# Patient Record
Sex: Female | Born: 1935 | Race: Black or African American | Hispanic: No | Smoking: Never smoker
Health system: Southern US, Community
[De-identification: ages and names within clinical notes are randomized; demographics above are authoritative.]

## PROBLEM LIST (undated history)

## (undated) DIAGNOSIS — E039 Hypothyroidism, unspecified: Secondary | ICD-10-CM

## (undated) DIAGNOSIS — K429 Umbilical hernia without obstruction or gangrene: Secondary | ICD-10-CM

## (undated) DIAGNOSIS — Z923 Personal history of irradiation: Secondary | ICD-10-CM

## (undated) DIAGNOSIS — R7303 Prediabetes: Secondary | ICD-10-CM

## (undated) DIAGNOSIS — C50919 Malignant neoplasm of unspecified site of unspecified female breast: Secondary | ICD-10-CM

## (undated) DIAGNOSIS — I10 Essential (primary) hypertension: Secondary | ICD-10-CM

## (undated) HISTORY — DX: Personal history of irradiation: Z92.3

## (undated) HISTORY — DX: Umbilical hernia without obstruction or gangrene: K42.9

## (undated) HISTORY — DX: Essential (primary) hypertension: I10

## (undated) HISTORY — PX: HERNIA REPAIR: SHX51

## (undated) HISTORY — DX: Hypothyroidism, unspecified: E03.9

---

## 1998-03-13 ENCOUNTER — Ambulatory Visit (HOSPITAL_COMMUNITY): Admission: RE | Admit: 1998-03-13 | Discharge: 1998-03-13 | Payer: Self-pay

## 1999-11-13 ENCOUNTER — Ambulatory Visit (HOSPITAL_COMMUNITY): Admission: RE | Admit: 1999-11-13 | Discharge: 1999-11-13 | Payer: Self-pay | Admitting: Obstetrics & Gynecology

## 2002-03-22 ENCOUNTER — Ambulatory Visit (HOSPITAL_COMMUNITY): Admission: RE | Admit: 2002-03-22 | Discharge: 2002-03-22 | Payer: Self-pay | Admitting: *Deleted

## 2009-09-21 ENCOUNTER — Ambulatory Visit: Payer: Self-pay | Admitting: Internal Medicine

## 2010-03-22 ENCOUNTER — Ambulatory Visit: Payer: Self-pay

## 2010-03-27 DIAGNOSIS — C50919 Malignant neoplasm of unspecified site of unspecified female breast: Secondary | ICD-10-CM

## 2010-03-27 HISTORY — DX: Malignant neoplasm of unspecified site of unspecified female breast: C50.919

## 2010-06-12 ENCOUNTER — Ambulatory Visit: Payer: Self-pay | Admitting: Surgery

## 2010-06-19 ENCOUNTER — Observation Stay: Payer: Medicare Other | Admitting: Surgery

## 2010-06-19 HISTORY — PX: MASTECTOMY: SHX3

## 2010-06-20 LAB — PATHOLOGY REPORT

## 2010-08-27 ENCOUNTER — Ambulatory Visit: Payer: Self-pay | Admitting: Internal Medicine

## 2010-08-29 ENCOUNTER — Ambulatory Visit: Payer: Self-pay | Admitting: Internal Medicine

## 2010-08-30 ENCOUNTER — Ambulatory Visit: Payer: Self-pay | Admitting: Internal Medicine

## 2010-08-31 ENCOUNTER — Ambulatory Visit: Payer: Self-pay | Admitting: Internal Medicine

## 2010-09-29 ENCOUNTER — Ambulatory Visit: Payer: Self-pay | Admitting: Internal Medicine

## 2010-10-29 ENCOUNTER — Ambulatory Visit: Payer: Self-pay | Admitting: Internal Medicine

## 2010-11-29 ENCOUNTER — Ambulatory Visit: Payer: Self-pay | Admitting: Internal Medicine

## 2010-12-29 ENCOUNTER — Ambulatory Visit: Payer: Self-pay | Admitting: Internal Medicine

## 2011-03-29 ENCOUNTER — Ambulatory Visit: Payer: Self-pay | Admitting: Internal Medicine

## 2011-04-18 LAB — CBC CANCER CENTER
Basophil #: 0.2 x10 3/mm — ABNORMAL HIGH (ref 0.0–0.1)
Basophil %: 2.5 %
Eosinophil #: 0.1 x10 3/mm (ref 0.0–0.7)
Eosinophil %: 1.7 %
HCT: 37.4 % (ref 35.0–47.0)
HGB: 12.2 g/dL (ref 12.0–16.0)
Lymphocyte #: 1.3 x10 3/mm (ref 1.0–3.6)
Lymphocyte %: 21.3 %
MCH: 28.5 pg (ref 26.0–34.0)
MCHC: 32.6 g/dL (ref 32.0–36.0)
MCV: 87.6 fL (ref 80–100)
Monocyte #: 0.5 x10 3/mm (ref 0.0–0.7)
Monocyte %: 7.9 %
Neutrophil #: 4.2 x10 3/mm (ref 1.4–6.5)
Neutrophil %: 66.6 %
Platelet: 228 x10 3/mm (ref 150–440)
RBC: 4.27 10*6/uL (ref 3.80–5.20)
RDW: 13.3 % (ref 11.5–14.5)
WBC: 6.3 x10 3/mm (ref 3.6–11.0)

## 2011-04-18 LAB — COMPREHENSIVE METABOLIC PANEL
Albumin: 3.7 g/dL (ref 3.4–5.0)
Alkaline Phosphatase: 75 U/L (ref 50–136)
Anion Gap: 7 (ref 7–16)
BUN: 21 mg/dL — ABNORMAL HIGH (ref 7–18)
Bilirubin,Total: 0.4 mg/dL (ref 0.2–1.0)
Calcium, Total: 9 mg/dL (ref 8.5–10.1)
Chloride: 102 mmol/L (ref 98–107)
Co2: 33 mmol/L — ABNORMAL HIGH (ref 21–32)
Creatinine: 0.91 mg/dL (ref 0.60–1.30)
EGFR (African American): >60
EGFR (Non-African Amer.): >60
Glucose: 118 mg/dL — ABNORMAL HIGH (ref 65–99)
Osmolality: 287 (ref 275–301)
Potassium: 4.2 mmol/L (ref 3.5–5.1)
SGOT(AST): 10 U/L — ABNORMAL LOW (ref 15–37)
SGPT (ALT): 17 U/L
Sodium: 142 mmol/L (ref 136–145)
Total Protein: 7.4 g/dL (ref 6.4–8.2)

## 2011-04-29 ENCOUNTER — Ambulatory Visit: Payer: Self-pay | Admitting: Internal Medicine

## 2011-06-03 ENCOUNTER — Ambulatory Visit: Payer: Self-pay | Admitting: Internal Medicine

## 2011-06-29 ENCOUNTER — Ambulatory Visit: Payer: Self-pay | Admitting: Internal Medicine

## 2011-11-07 ENCOUNTER — Ambulatory Visit: Payer: Self-pay | Admitting: Internal Medicine

## 2011-12-04 ENCOUNTER — Ambulatory Visit: Payer: Self-pay | Admitting: Internal Medicine

## 2011-12-29 ENCOUNTER — Ambulatory Visit: Payer: Self-pay | Admitting: Internal Medicine

## 2012-02-13 ENCOUNTER — Ambulatory Visit: Payer: Self-pay | Admitting: Internal Medicine

## 2012-02-13 LAB — CBC CANCER CENTER
Basophil #: 0.1 x10 3/mm (ref 0.0–0.1)
Basophil %: 1.1 %
Eosinophil #: 0.1 x10 3/mm (ref 0.0–0.7)
Eosinophil %: 2.1 %
HCT: 37.8 % (ref 35.0–47.0)
HGB: 12.1 g/dL (ref 12.0–16.0)
Lymphocyte #: 1.5 x10 3/mm (ref 1.0–3.6)
Lymphocyte %: 29.3 %
MCH: 27.8 pg (ref 26.0–34.0)
MCHC: 32.1 g/dL (ref 32.0–36.0)
MCV: 87 fL (ref 80–100)
Monocyte #: 0.5 x10 3/mm (ref 0.2–0.9)
Monocyte %: 9.3 %
Neutrophil #: 3 x10 3/mm (ref 1.4–6.5)
Neutrophil %: 58.2 %
Platelet: 212 x10 3/mm (ref 150–440)
RBC: 4.36 10*6/uL (ref 3.80–5.20)
RDW: 13.8 % (ref 11.5–14.5)
WBC: 5.1 x10 3/mm (ref 3.6–11.0)

## 2012-02-13 LAB — HEPATIC FUNCTION PANEL A (ARMC)
Albumin: 3.7 g/dL (ref 3.4–5.0)
Alkaline Phosphatase: 65 U/L (ref 50–136)
Bilirubin, Direct: 0.1 mg/dL (ref 0.00–0.20)
Bilirubin,Total: 0.3 mg/dL (ref 0.2–1.0)
SGOT(AST): 15 U/L (ref 15–37)
SGPT (ALT): 20 U/L (ref 12–78)
Total Protein: 7.4 g/dL (ref 6.4–8.2)

## 2012-02-13 LAB — CREATININE, SERUM
Creatinine: 0.83 mg/dL (ref 0.60–1.30)
EGFR (African American): >60
EGFR (Non-African Amer.): >60

## 2012-02-29 ENCOUNTER — Ambulatory Visit: Payer: Self-pay | Admitting: Internal Medicine

## 2012-04-24 ENCOUNTER — Ambulatory Visit: Payer: Self-pay | Admitting: Internal Medicine

## 2012-08-11 IMAGING — MG MM DIGITAL DIAGNOSTIC UNILAT*R* W/ CAD
1 series · 3 of 3 positions shown · non-contrast
Comparison: none

REASON FOR EXAM: HX BRST CA LT MAST
COMMENTS:

[Series 6089: R CC · right · 3 of 3 slices shown]
[im 1/3]
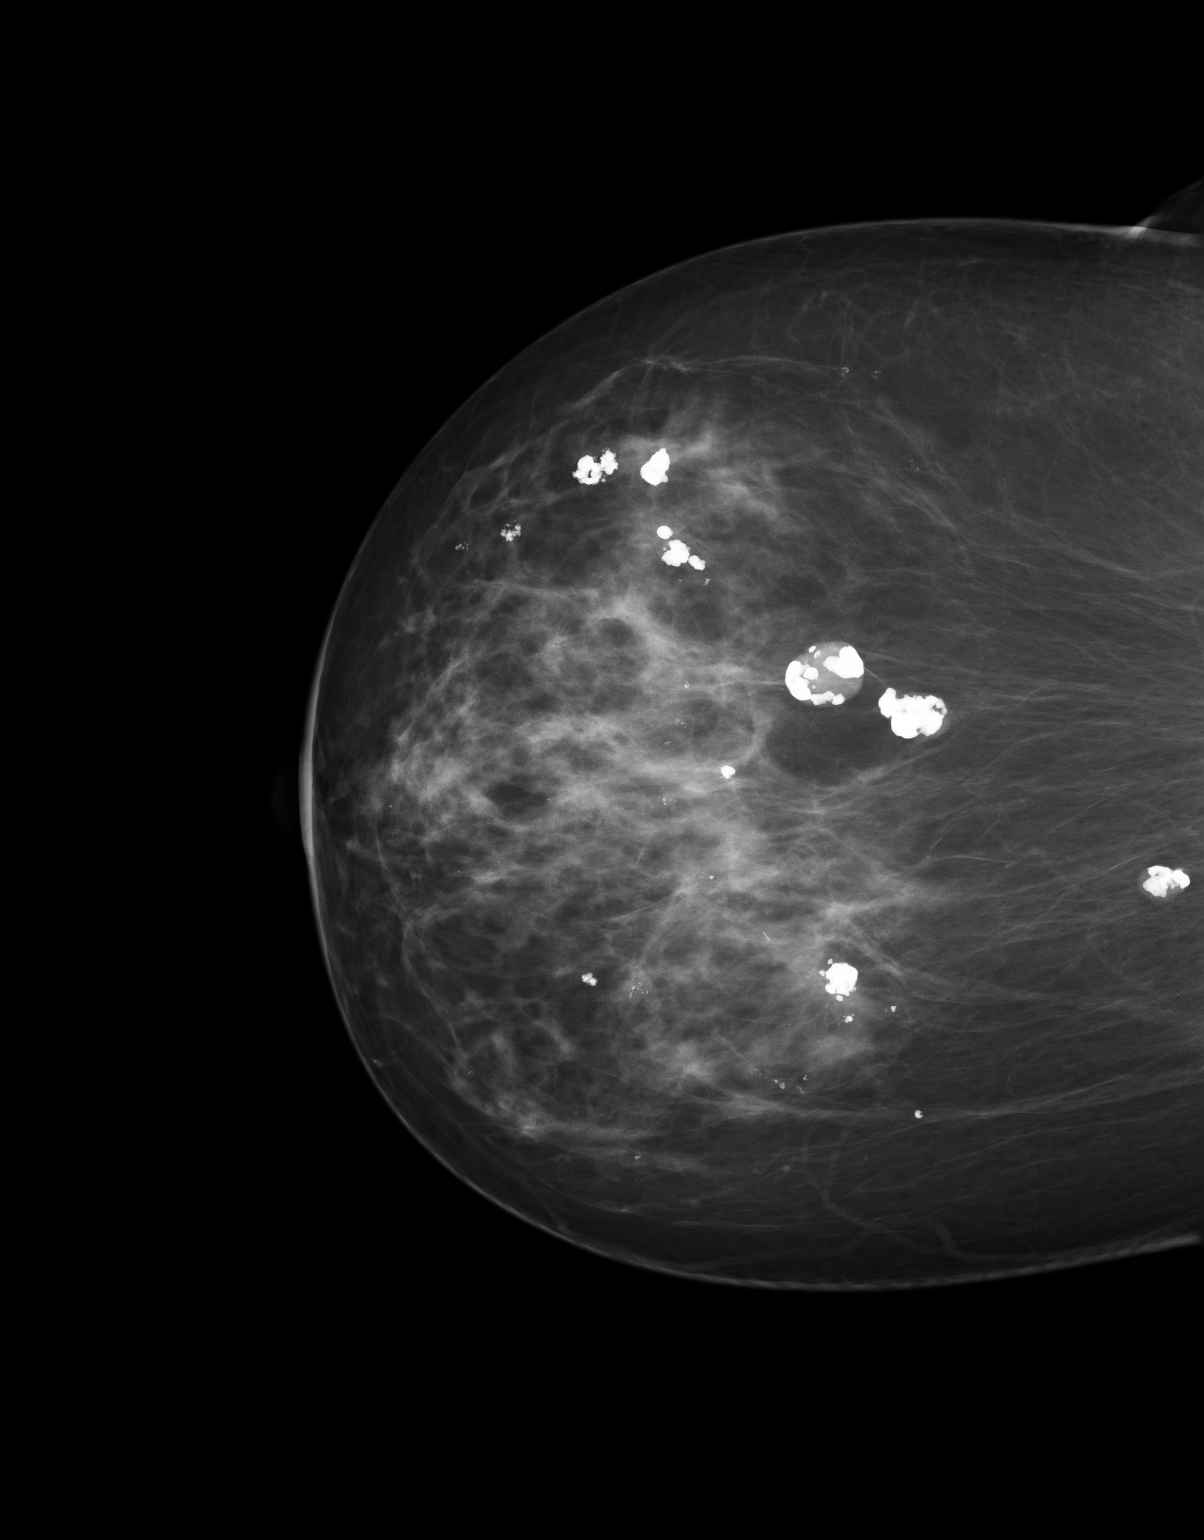
[im 2/3]
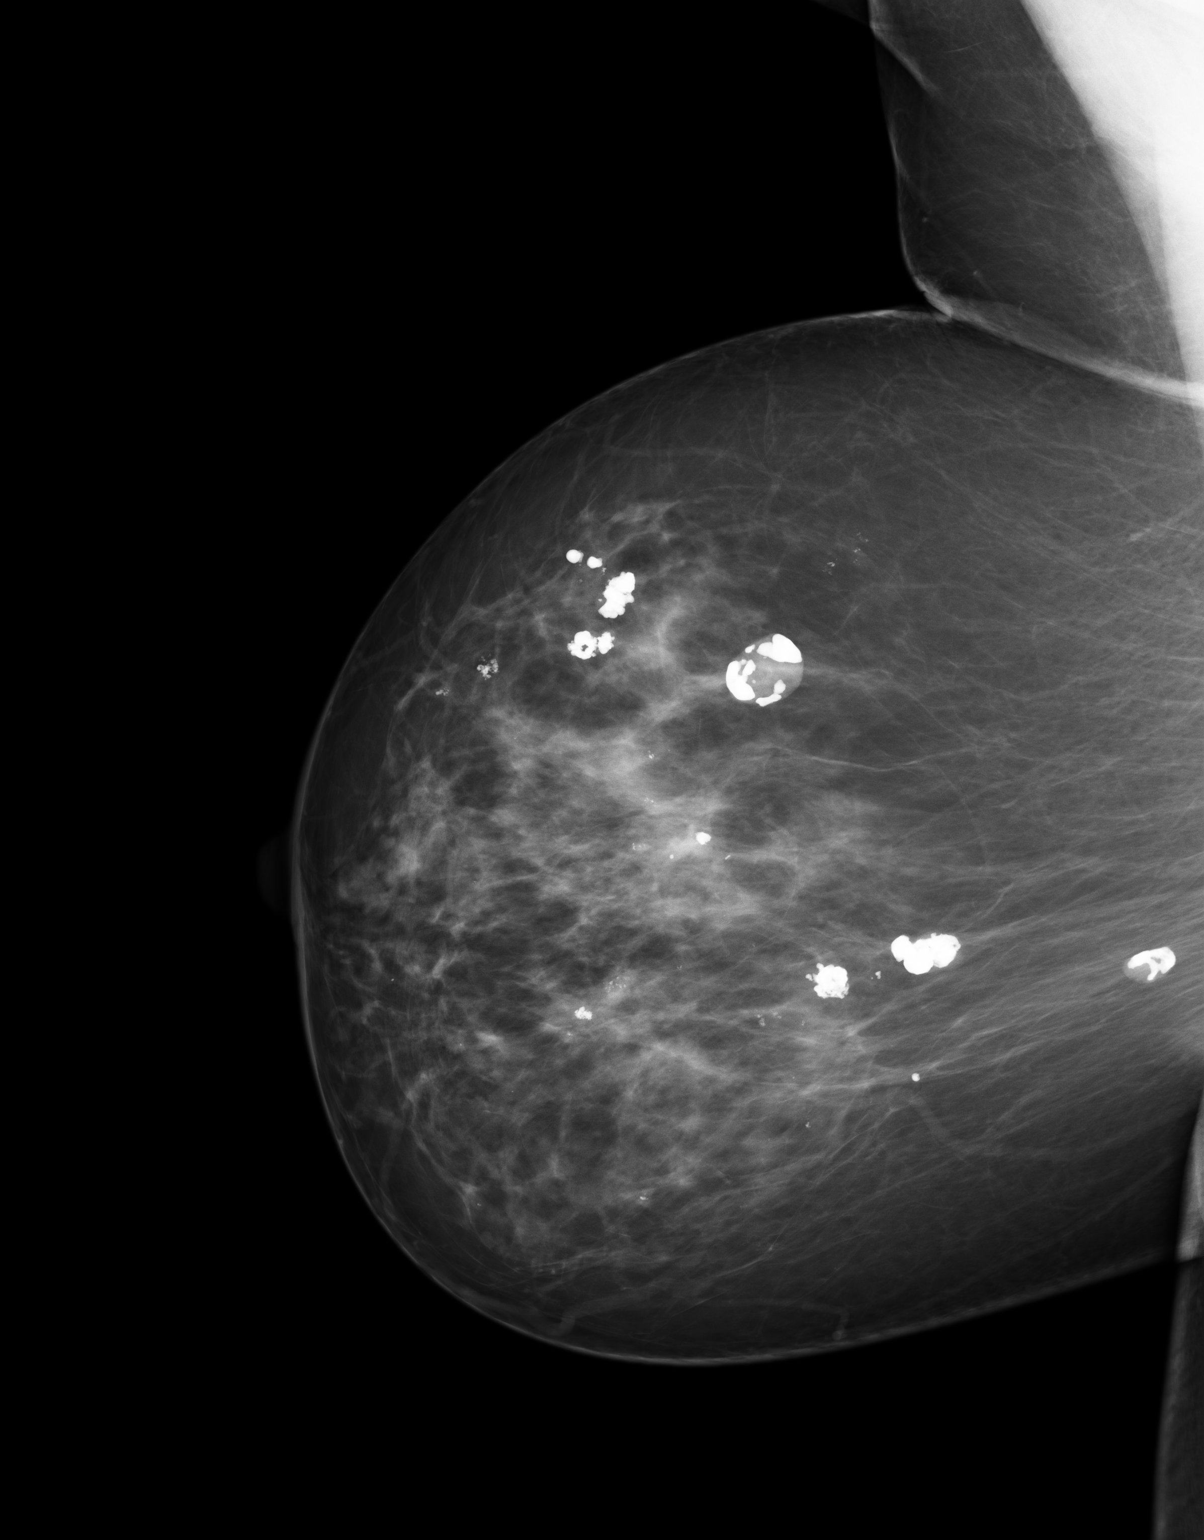
[im 3/3]
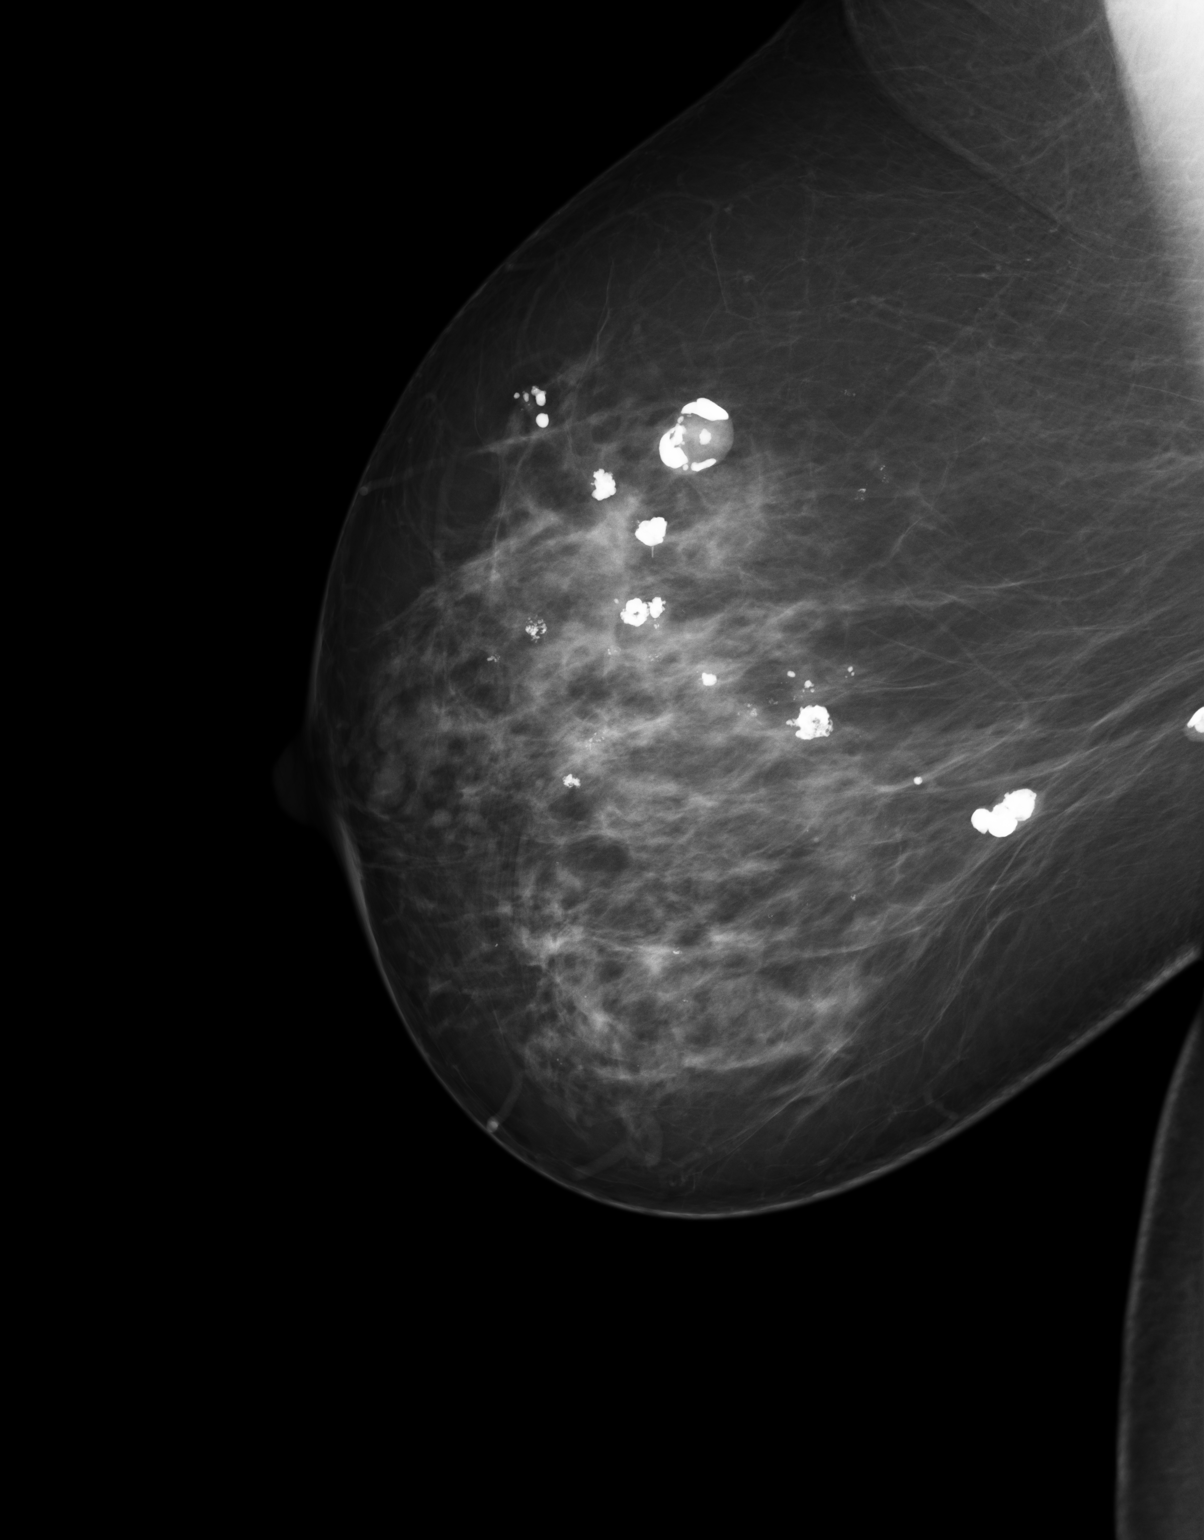

[3 of 3 positions shown; findings below may reference images not displayed]

PROCEDURE:     MMM - MMM DIG UNILATERAL RT W/CAD  - April 23, 2011  [DATE]

RESULT:     The patient has had prior left mastectomy for breast carcinoma.
The current right mammogram is compared to the appearance of the right
breast on the prior bilateral mammogram March 22, 2010. There are
scattered fibroglandular elements throughout the right breast. Multiple
large, benign appearing calcifications are also noted diffusely throughout
the breast. No calcifications suspicious for malignancy are seen. No
dominant mass is identified.
IMPRESSION: 1. Stable, benign appearing unilateral right mammogram.
2. Continued annual right mammography is recommended.

BI-RADS: Category 2 - Benign Finding.

A NEGATIVE MAMMOGRAM REPORT DOES NOT PRECLUDE BIOPSY OR OTHER EVALUATION OF
A CLINICALLY PALPABLE OR OTHERWISE SUSPICIOUS MASS OR LESION. BREAST CANCER
MAY NOT BE DETECTED BY MAMMOGRAPHY IN UP TO 10% OF CASES.

## 2012-12-02 ENCOUNTER — Ambulatory Visit: Payer: Self-pay | Admitting: Radiation Oncology

## 2013-01-18 DIAGNOSIS — R600 Localized edema: Secondary | ICD-10-CM | POA: Insufficient documentation

## 2013-01-18 DIAGNOSIS — R609 Edema, unspecified: Secondary | ICD-10-CM | POA: Insufficient documentation

## 2013-03-31 ENCOUNTER — Ambulatory Visit: Payer: Self-pay | Admitting: Radiation Oncology

## 2013-04-15 LAB — CBC CANCER CENTER
Basophil #: 0 x10 3/mm (ref 0.0–0.1)
Basophil %: 0.6 %
Eosinophil #: 0.1 x10 3/mm (ref 0.0–0.7)
Eosinophil %: 1.7 %
HCT: 38.9 % (ref 35.0–47.0)
HGB: 12.3 g/dL (ref 12.0–16.0)
Lymphocyte #: 1.4 x10 3/mm (ref 1.0–3.6)
Lymphocyte %: 29 %
MCH: 28 pg (ref 26.0–34.0)
MCHC: 31.7 g/dL — ABNORMAL LOW (ref 32.0–36.0)
MCV: 89 fL (ref 80–100)
Monocyte #: 0.4 x10 3/mm (ref 0.2–0.9)
Monocyte %: 8.6 %
Neutrophil #: 3 x10 3/mm (ref 1.4–6.5)
Neutrophil %: 60.1 %
Platelet: 231 x10 3/mm (ref 150–440)
RBC: 4.39 10*6/uL (ref 3.80–5.20)
RDW: 14 % (ref 11.5–14.5)
WBC: 5 x10 3/mm (ref 3.6–11.0)

## 2013-04-15 LAB — HEPATIC FUNCTION PANEL A (ARMC)
Albumin: 3.7 g/dL (ref 3.4–5.0)
Alkaline Phosphatase: 64 U/L
Bilirubin, Direct: 0.1 mg/dL (ref 0.00–0.20)
Bilirubin,Total: 0.4 mg/dL (ref 0.2–1.0)
SGOT(AST): 12 U/L — ABNORMAL LOW (ref 15–37)
SGPT (ALT): 20 U/L (ref 12–78)
Total Protein: 7.3 g/dL (ref 6.4–8.2)

## 2013-04-15 LAB — CREATININE, SERUM
Creatinine: 0.82 mg/dL (ref 0.60–1.30)
EGFR (African American): >60

## 2013-04-28 ENCOUNTER — Ambulatory Visit: Payer: Self-pay | Admitting: Radiation Oncology

## 2014-03-23 DIAGNOSIS — K439 Ventral hernia without obstruction or gangrene: Secondary | ICD-10-CM | POA: Insufficient documentation

## 2014-04-08 DIAGNOSIS — K429 Umbilical hernia without obstruction or gangrene: Secondary | ICD-10-CM | POA: Insufficient documentation

## 2014-04-21 ENCOUNTER — Ambulatory Visit
Admit: 2014-04-21 | Disposition: A | Discharge status: Home / Self Care | Payer: Self-pay | Attending: Internal Medicine | Admitting: Internal Medicine

## 2014-05-05 ENCOUNTER — Ambulatory Visit
Admit: 2014-05-05 | Disposition: A | Discharge status: Home / Self Care | Payer: Self-pay | Attending: Internal Medicine | Admitting: Internal Medicine

## 2014-05-05 LAB — CBC CANCER CENTER
Basophil #: 0.1 x10 3/mm (ref 0.0–0.1)
Basophil %: 1.1 %
Eosinophil #: 0.1 x10 3/mm (ref 0.0–0.7)
Eosinophil %: 2.5 %
HCT: 38.7 % (ref 35.0–47.0)
HGB: 12.5 g/dL (ref 12.0–16.0)
Lymphocyte #: 1.6 x10 3/mm (ref 1.0–3.6)
Lymphocyte %: 31.1 %
MCH: 27.8 pg (ref 26.0–34.0)
MCHC: 32.3 g/dL (ref 32.0–36.0)
MCV: 86 fL (ref 80–100)
Monocyte #: 0.4 x10 3/mm (ref 0.2–0.9)
Monocyte %: 8.7 %
Neutrophil #: 2.9 x10 3/mm (ref 1.4–6.5)
Neutrophil %: 56.6 %
Platelet: 213 x10 3/mm (ref 150–440)
RBC: 4.49 10*6/uL (ref 3.80–5.20)
RDW: 14 % (ref 11.5–14.5)
WBC: 5.1 x10 3/mm (ref 3.6–11.0)

## 2014-05-05 LAB — CREATININE, SERUM
Creatinine: 0.61 mg/dL
EGFR (Non-African Amer.): >60

## 2014-05-05 LAB — HEPATIC FUNCTION PANEL A (ARMC)
Albumin: 4.2 g/dL
Alkaline Phosphatase: 50 U/L
Bilirubin, Direct: <0.1 mg/dL
Bilirubin,Total: 0.7 mg/dL
SGOT(AST): 13 U/L — ABNORMAL LOW
SGPT (ALT): 10 U/L — ABNORMAL LOW
Total Protein: 7.3 g/dL

## 2014-10-26 ENCOUNTER — Other Ambulatory Visit: Payer: Self-pay | Admitting: *Deleted

## 2014-10-26 DIAGNOSIS — C50912 Malignant neoplasm of unspecified site of left female breast: Secondary | ICD-10-CM

## 2015-05-09 ENCOUNTER — Ambulatory Visit
Admission: RE | Admit: 2015-05-09 | Discharge: 2015-05-09 | Disposition: A | Discharge status: Home / Self Care | Payer: Medicare Other | Source: Ambulatory Visit | Attending: Internal Medicine | Admitting: Internal Medicine

## 2015-05-09 DIAGNOSIS — Z1231 Encounter for screening mammogram for malignant neoplasm of breast: Secondary | ICD-10-CM | POA: Insufficient documentation

## 2015-05-09 DIAGNOSIS — C50912 Malignant neoplasm of unspecified site of left female breast: Secondary | ICD-10-CM

## 2015-05-09 DIAGNOSIS — R928 Other abnormal and inconclusive findings on diagnostic imaging of breast: Secondary | ICD-10-CM | POA: Insufficient documentation

## 2015-05-09 HISTORY — DX: Malignant neoplasm of unspecified site of unspecified female breast: C50.919

## 2015-05-10 ENCOUNTER — Other Ambulatory Visit: Payer: Self-pay | Admitting: Internal Medicine

## 2015-05-10 DIAGNOSIS — R928 Other abnormal and inconclusive findings on diagnostic imaging of breast: Secondary | ICD-10-CM

## 2015-05-11 ENCOUNTER — Ambulatory Visit: Payer: Self-pay

## 2015-05-11 ENCOUNTER — Other Ambulatory Visit: Payer: Self-pay

## 2015-05-15 ENCOUNTER — Encounter: Payer: Self-pay | Admitting: *Deleted

## 2015-05-15 ENCOUNTER — Other Ambulatory Visit: Payer: Self-pay | Admitting: *Deleted

## 2015-05-15 DIAGNOSIS — I10 Essential (primary) hypertension: Secondary | ICD-10-CM | POA: Insufficient documentation

## 2015-05-15 DIAGNOSIS — E039 Hypothyroidism, unspecified: Secondary | ICD-10-CM | POA: Insufficient documentation

## 2015-05-15 DIAGNOSIS — Z853 Personal history of malignant neoplasm of breast: Secondary | ICD-10-CM

## 2015-05-16 ENCOUNTER — Inpatient Hospital Stay (HOSPITAL_BASED_OUTPATIENT_CLINIC_OR_DEPARTMENT_OTHER): Payer: Medicare Other | Admitting: Internal Medicine

## 2015-05-16 ENCOUNTER — Inpatient Hospital Stay: Payer: Medicare Other | Attending: Internal Medicine

## 2015-05-16 ENCOUNTER — Encounter: Payer: Self-pay | Admitting: Internal Medicine

## 2015-05-16 VITALS — BP 192/73 | HR 69 | Temp 98.2°F | Resp 16 | Ht 66.0 in | Wt 228.8 lb

## 2015-05-16 DIAGNOSIS — E039 Hypothyroidism, unspecified: Secondary | ICD-10-CM | POA: Diagnosis not present

## 2015-05-16 DIAGNOSIS — Z923 Personal history of irradiation: Secondary | ICD-10-CM | POA: Insufficient documentation

## 2015-05-16 DIAGNOSIS — Z79899 Other long term (current) drug therapy: Secondary | ICD-10-CM | POA: Insufficient documentation

## 2015-05-16 DIAGNOSIS — Z853 Personal history of malignant neoplasm of breast: Secondary | ICD-10-CM | POA: Insufficient documentation

## 2015-05-16 DIAGNOSIS — I10 Essential (primary) hypertension: Secondary | ICD-10-CM | POA: Insufficient documentation

## 2015-05-16 DIAGNOSIS — C50912 Malignant neoplasm of unspecified site of left female breast: Secondary | ICD-10-CM

## 2015-05-16 DIAGNOSIS — Z9012 Acquired absence of left breast and nipple: Secondary | ICD-10-CM | POA: Diagnosis not present

## 2015-05-16 DIAGNOSIS — Z7982 Long term (current) use of aspirin: Secondary | ICD-10-CM | POA: Insufficient documentation

## 2015-05-16 DIAGNOSIS — C50911 Malignant neoplasm of unspecified site of right female breast: Secondary | ICD-10-CM

## 2015-05-16 LAB — CBC WITH DIFFERENTIAL/PLATELET
BASOS ABS: 0.1 10*3/uL (ref 0–0.1)
BASOS PCT: 1 %
EOS ABS: 0.1 10*3/uL (ref 0–0.7)
Eosinophils Relative: 2 %
HCT: 37.7 % (ref 35.0–47.0)
HEMOGLOBIN: 12.4 g/dL (ref 12.0–16.0)
Lymphocytes Relative: 30 %
Lymphs Abs: 1.6 10*3/uL (ref 1.0–3.6)
MCH: 28.8 pg (ref 26.0–34.0)
MCHC: 32.9 g/dL (ref 32.0–36.0)
MCV: 87.5 fL (ref 80.0–100.0)
Monocytes Absolute: 0.4 10*3/uL (ref 0.2–0.9)
Monocytes Relative: 7 %
NEUTROS PCT: 60 %
Neutro Abs: 3.2 10*3/uL (ref 1.4–6.5)
Platelets: 194 10*3/uL (ref 150–440)
RBC: 4.31 MIL/uL (ref 3.80–5.20)
RDW: 14 % (ref 11.5–14.5)
WBC: 5.3 10*3/uL (ref 3.6–11.0)

## 2015-05-16 LAB — COMPREHENSIVE METABOLIC PANEL
ALBUMIN: 4.2 g/dL (ref 3.5–5.0)
ALK PHOS: 52 U/L (ref 38–126)
ALT: 11 U/L — AB (ref 14–54)
ANION GAP: 4 — AB (ref 5–15)
AST: 15 U/L (ref 15–41)
BUN: 15 mg/dL (ref 6–20)
CALCIUM: 8.9 mg/dL (ref 8.9–10.3)
CO2: 28 mmol/L (ref 22–32)
Chloride: 108 mmol/L (ref 101–111)
Creatinine, Ser: 0.72 mg/dL (ref 0.44–1.00)
GFR calc Af Amer: >60 mL/min (ref >60)
GFR calc non Af Amer: >60 mL/min (ref >60)
GLUCOSE: 110 mg/dL — AB (ref 65–99)
Potassium: 3.9 mmol/L (ref 3.5–5.1)
SODIUM: 140 mmol/L (ref 135–145)
Total Bilirubin: 0.7 mg/dL (ref 0.3–1.2)
Total Protein: 7.3 g/dL (ref 6.5–8.1)

## 2015-05-16 NOTE — Progress Notes (Signed)
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 I reviewed the patient's prior charts/pertinent labs/imaging in detail; findings are summarized above.    82 year old female patient with above history of  Left breast malignant  Fluid loads tumor status post  Mastectomy followed by  Radiation is here for follow-up.  Patient denies any lumps or bumps. Denies any unusual shortness of breath or cough. No fever or chills.  Appetite is good. Chronic back pain. Not any worse.   REVIEW OF SYSTEMS:  A complete 10 point review of system is done which is negative except mentioned above/history of present illness.   PAST MEDICAL HISTORY :  Past Medical History  Diagnosis Date  . Breast cancer ([place]) 03/27/10    lt mastectomy/radiation  . History of radiation therapy   . Hypertension   . Hypothyroidism   . Umbilical hernia     PAST SURGICAL HISTORY :   Past Surgical History  Procedure Laterality Date  . Mastectomy Left 06/19/2010    had radiation  . Hernia repair      FAMILY HISTORY :   Family History  Problem Relation Age of Onset  . Hypertension Father   . Hypertension Sister   . Diabetes Sister     SOCIAL HISTORY:   Social History  Substance Use Topics  . Smoking status: Never Smoker   . Smokeless tobacco: Never Used  . Alcohol Use: No    ALLERGIES:  is allergic to aspartame; nickel; orange oil; and yellow dye.  MEDICATIONS:  Current Outpatient Prescriptions  Medication Sig Dispense Refill  . aspirin EC 81 MG tablet Take 1 tablet by mouth daily.    . Cholecalciferol 4000 units CAPS Take 1 tablet by mouth daily.    Marland Kitchen  levothyroxine (SYNTHROID, LEVOTHROID) 50 MCG tablet Take 1 tablet by mouth daily.    Marland Kitchen lisinopril (PRINIVIL,ZESTRIL) 10 MG tablet Take 10 mg by mouth.    . torsemide (DEMADEX) 5 MG tablet Take 5 mg by mouth daily.     No current facility-administered medications for this visit.    PHYSICAL EXAMINATION: ECOG PERFORMANCE STATUS: 0 - Asymptomatic  BP 192/73 mmHg  Pulse 69  Temp(Src) 98.2 F (36.8 C) (Tympanic)  Resp 16  Ht 5\' 6"  (1.676 m)  Wt 228 lb 13.4 oz (103.8 kg)  BMI 36.95 kg/m2  Filed Weights   05/16/15 1013  Weight: 228 lb 13.4 oz (103.8 kg)    GENERAL: Well-nourished well-developed; Alert, no distress and comfortable.  Obese. Alone.  EYES: no pallor or icterus OROPHARYNX: no thrush or ulceration; good dentition  NECK: supple, no masses felt LYMPH:  no palpable lymphadenopathy in the cervical, axillary or inguinal regions LUNGS: clear to auscultation and  No wheeze or crackles HEART/CVS: regular rate & rhythm and no murmurs; No lower extremity edema ABDOMEN:abdomen soft, non-tender and normal bowel sounds Musculoskeletal:no cyanosis of digits and no clubbing  PSYCH: alert & oriented x 3 with fluent speech NEURO: no focal motor/sensory deficits SKIN:  no rashes or significant lesions Right and left BREAST exam [in the presence of nurse]- no unusual skin changes or dominant masses felt. Surgical scars noted-  Left mastectomy changes. Right  breast no dominant masses felt.   LABORATORY DATA:  I have reviewed the data as listed    Component Value Date/Time   NA 140 05/16/2015 0941   NA 142 04/18/2011 1101   K 3.9 05/16/2015 0941   K 4.2 04/18/2011 1101   CL 108 05/16/2015 0941   CL 102 04/18/2011 1101   CO2 28 05/16/2015 0941   CO2 33* 04/18/2011 1101   GLUCOSE 110* 05/16/2015 0941   GLUCOSE 118* 04/18/2011 1101   BUN 15 05/16/2015 0941   BUN 21* 04/18/2011 1101   CREATININE 0.72 05/16/2015 0941   CREATININE 0.61 05/05/2014 0853   CALCIUM 8.9 05/16/2015 0941    CALCIUM 9.0 04/18/2011 1101   PROT 7.3 05/16/2015 0941   PROT 7.3 05/05/2014 0853   ALBUMIN 4.2 05/16/2015 0941   ALBUMIN 4.2 05/05/2014 0853   AST 15 05/16/2015 0941   AST 13* 05/05/2014 0853   ALT 11* 05/16/2015 0941   ALT 10* 05/05/2014 0853   ALKPHOS 52 05/16/2015 0941   ALKPHOS 50 05/05/2014 0853   BILITOT 0.7 05/16/2015 0941   BILITOT 0.7 05/05/2014 0853   GFRNONAA >60 05/16/2015 0941   GFRNONAA >60 05/05/2014 0853   GFRNONAA >60 04/18/2011 1101   GFRAA >60 05/16/2015 0941   GFRAA >60 05/05/2014 0853   GFRAA >60 04/18/2011 1101    No results found for: SPEP, UPEP  Lab Results  Component Value Date   WBC 5.3 05/16/2015   NEUTROABS 3.2 05/16/2015   HGB 12.4 05/16/2015   HCT 37.7 05/16/2015   MCV 87.5 05/16/2015   PLT 194 05/16/2015      Chemistry      Component Value Date/Time   NA 140 05/16/2015 0941   NA 142 04/18/2011 1101   K 3.9 05/16/2015 0941   K 4.2 04/18/2011 1101   CL 108 05/16/2015 0941   CL 102 04/18/2011 1101   CO2 28 05/16/2015 0941   CO2 33* 04/18/2011 1101   BUN 15 05/16/2015 0941   BUN 21* 04/18/2011 1101   CREATININE 0.72 05/16/2015 0941   CREATININE 0.61 05/05/2014 0853      Component Value Date/Time   CALCIUM 8.9 05/16/2015 0941   CALCIUM 9.0 04/18/2011 1101   ALKPHOS 52 05/16/2015 0941   ALKPHOS 50 05/05/2014 0853   AST 15 05/16/2015 0941   AST 13* 05/05/2014 0853   ALT 11* 05/16/2015 0941   ALT 10* 05/05/2014 0853   BILITOT 0.7 05/16/2015 0941   BILITOT 0.7 05/05/2014 0853         ASSESSMENT & PLAN:   #  Malignant Phyllodes  the left breast-  Status post mastectomy followed by postmastectomy radiation. No chemotherapy.  Clinically no evidence of recurrence. CBC CMP within normal limits.  #  Left breast mammogram-  Inconclusive April 2017.  Patient is recommended diagnostic/ if need ultrasound of the breast.  Clinical breast exam did not show any dominant lumps or bumps.  #  Patient follow-up with me in one year with  labs.     Cammie Sickle, MD 05/16/2015 10:48 AM

## 2015-05-16 NOTE — Progress Notes (Signed)
Chaperoned provider with Breast Exam 

## 2015-05-26 ENCOUNTER — Other Ambulatory Visit: Payer: Medicare Other

## 2015-05-26 ENCOUNTER — Ambulatory Visit: Payer: Medicare Other | Attending: Internal Medicine

## 2016-05-14 ENCOUNTER — Inpatient Hospital Stay (HOSPITAL_BASED_OUTPATIENT_CLINIC_OR_DEPARTMENT_OTHER): Payer: Medicare Other | Admitting: Internal Medicine

## 2016-05-14 ENCOUNTER — Inpatient Hospital Stay: Payer: Medicare Other | Attending: Internal Medicine

## 2016-05-14 DIAGNOSIS — Z923 Personal history of irradiation: Secondary | ICD-10-CM | POA: Insufficient documentation

## 2016-05-14 DIAGNOSIS — C50911 Malignant neoplasm of unspecified site of right female breast: Secondary | ICD-10-CM

## 2016-05-14 DIAGNOSIS — Z9012 Acquired absence of left breast and nipple: Secondary | ICD-10-CM | POA: Insufficient documentation

## 2016-05-14 DIAGNOSIS — E039 Hypothyroidism, unspecified: Secondary | ICD-10-CM | POA: Insufficient documentation

## 2016-05-14 DIAGNOSIS — Z7982 Long term (current) use of aspirin: Secondary | ICD-10-CM | POA: Diagnosis not present

## 2016-05-14 DIAGNOSIS — Z853 Personal history of malignant neoplasm of breast: Secondary | ICD-10-CM

## 2016-05-14 DIAGNOSIS — I10 Essential (primary) hypertension: Secondary | ICD-10-CM | POA: Insufficient documentation

## 2016-05-14 DIAGNOSIS — C50912 Malignant neoplasm of unspecified site of left female breast: Secondary | ICD-10-CM | POA: Insufficient documentation

## 2016-05-14 LAB — CBC WITH DIFFERENTIAL/PLATELET
BASOS ABS: 0 10*3/uL (ref 0–0.1)
BASOS PCT: 1 %
EOS PCT: 2 %
Eosinophils Absolute: 0.1 10*3/uL (ref 0–0.7)
HEMATOCRIT: 41.3 % (ref 35.0–47.0)
Hemoglobin: 13.2 g/dL (ref 12.0–16.0)
LYMPHS PCT: 30 %
Lymphs Abs: 1.6 10*3/uL (ref 1.0–3.6)
MCH: 27.9 pg (ref 26.0–34.0)
MCHC: 31.9 g/dL — AB (ref 32.0–36.0)
MCV: 87.6 fL (ref 80.0–100.0)
MONO ABS: 0.4 10*3/uL (ref 0.2–0.9)
MONOS PCT: 8 %
Neutro Abs: 3.1 10*3/uL (ref 1.4–6.5)
Neutrophils Relative %: 59 %
PLATELETS: 238 10*3/uL (ref 150–440)
RBC: 4.71 MIL/uL (ref 3.80–5.20)
RDW: 13.9 % (ref 11.5–14.5)
WBC: 5.3 10*3/uL (ref 3.6–11.0)

## 2016-05-14 LAB — COMPREHENSIVE METABOLIC PANEL
ALT: 13 U/L — ABNORMAL LOW (ref 14–54)
ANION GAP: 6 (ref 5–15)
AST: 16 U/L (ref 15–41)
Albumin: 4.4 g/dL (ref 3.5–5.0)
Alkaline Phosphatase: 52 U/L (ref 38–126)
BILIRUBIN TOTAL: 0.9 mg/dL (ref 0.3–1.2)
BUN: 18 mg/dL (ref 6–20)
CHLORIDE: 99 mmol/L — AB (ref 101–111)
CO2: 30 mmol/L (ref 22–32)
Calcium: 9.3 mg/dL (ref 8.9–10.3)
Creatinine, Ser: 0.74 mg/dL (ref 0.44–1.00)
GFR calc Af Amer: >60 mL/min (ref >60)
Glucose, Bld: 105 mg/dL — ABNORMAL HIGH (ref 65–99)
Potassium: 4.5 mmol/L (ref 3.5–5.1)
Sodium: 135 mmol/L (ref 135–145)
TOTAL PROTEIN: 7.8 g/dL (ref 6.5–8.1)

## 2016-05-14 NOTE — Addendum Note (Signed)
Addended by: Vernetta Honey on: 05/14/2016 12:13 PM   Modules accepted: Orders

## 2016-05-14 NOTE — Progress Notes (Signed)
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   # Thyroid nodules- f/u PCP  INTERVAL HISTORY:   83 year old female patient with above history of  Left breast malignant phyllodes  Tumor s/p Mastectomy followed by  Radiation is here for follow-up.   Patient noted to have a asymmetry of the right mammogram in April 2017 chest was recommended further workup she declined.   Patient denies any lumps or bumps. Denies any unusual shortness of breath or cough. No fever or chills.  Appetite is good. Chronic back pain. Not any worse.   REVIEW OF SYSTEMS:  A complete 10 point review of system is done which is negative except mentioned above/history of present illness.   PAST MEDICAL HISTORY :  Past Medical History:  Diagnosis Date  . Breast cancer ([place]) 03/27/10   lt mastectomy/radiation  . History of radiation therapy   . Hypertension   . Hypothyroidism   . Umbilical hernia     PAST SURGICAL HISTORY :   Past Surgical History:  Procedure Laterality Date  . HERNIA REPAIR    . MASTECTOMY Left 06/19/2010   had radiation    FAMILY HISTORY :   Family History  Problem Relation Age of Onset  . Hypertension Father   . Hypertension Sister   . Diabetes Sister     SOCIAL HISTORY:   Social History  Substance Use Topics  . Smoking status: Never Smoker  . Smokeless tobacco: Never Used  . Alcohol use No    ALLERGIES:  is allergic to aspartame; nickel; orange oil; and yellow dye.  MEDICATIONS:  Current Outpatient Prescriptions  Medication Sig Dispense Refill  . aspirin EC 81 MG tablet Take 1 tablet by mouth daily.    . Cholecalciferol 4000 units CAPS Take 1 tablet by mouth daily.    Marland Kitchen levothyroxine (SYNTHROID, LEVOTHROID) 50 MCG tablet  Take 1 tablet by mouth daily.    Marland Kitchen lisinopril (PRINIVIL,ZESTRIL) 10 MG tablet Take 10 mg by mouth.    . torsemide (DEMADEX) 5 MG tablet Take 5 mg by mouth daily.     No current facility-administered medications for this visit.     PHYSICAL EXAMINATION: ECOG PERFORMANCE STATUS: 0 - Asymptomatic  BP (!) 196/81 (BP Location: Left Arm, Patient Position: Sitting)   Pulse 66   Temp 98.7 F (37.1 C) (Tympanic)   Resp 18   Wt 230 lb 7.9 oz (104.5 kg)   BMI 37.20 kg/m   Filed Weights   05/14/16 0910  Weight: 230 lb 7.9 oz (104.5 kg)    GENERAL: Well-nourished well-developed; Alert, no distress and comfortable.  Obese. Alone.  EYES: no pallor or icterus OROPHARYNX: no thrush or ulceration; good dentition  NECK: supple, no masses felt LYMPH:  no palpable lymphadenopathy in the cervical, axillary or inguinal regions LUNGS: clear to auscultation and  No wheeze or crackles HEART/CVS: regular rate & rhythm and no murmurs; No lower extremity edema ABDOMEN:abdomen soft, non-tender and normal bowel sounds Musculoskeletal:no cyanosis of digits and no clubbing  PSYCH: alert & oriented x 3 with fluent speech NEURO: no focal motor/sensory deficits SKIN:  no rashes or significant lesions Right and left BREAST exam [in the presence of nurse]- no unusual skin changes or dominant masses felt. Surgical scars noted-  Left  mastectomy changes. Right breast no dominant masses felt.   LABORATORY DATA:  I have reviewed the data as listed    Component Value Date/Time   NA 135 05/14/2016 0856   NA 142 04/18/2011 1101   K 4.5 05/14/2016 0856   K 4.2 04/18/2011 1101   CL 99 (L) 05/14/2016 0856   CL 102 04/18/2011 1101   CO2 30 05/14/2016 0856   CO2 33 (H) 04/18/2011 1101   GLUCOSE 105 (H) 05/14/2016 0856   GLUCOSE 118 (H) 04/18/2011 1101   BUN 18 05/14/2016 0856   BUN 21 (H) 04/18/2011 1101   CREATININE 0.74 05/14/2016 0856   CREATININE 0.61 05/05/2014 0853   CALCIUM 9.3 05/14/2016 0856    CALCIUM 9.0 04/18/2011 1101   PROT 7.8 05/14/2016 0856   PROT 7.3 05/05/2014 0853   ALBUMIN 4.4 05/14/2016 0856   ALBUMIN 4.2 05/05/2014 0853   AST 16 05/14/2016 0856   AST 13 (L) 05/05/2014 0853   ALT 13 (L) 05/14/2016 0856   ALT 10 (L) 05/05/2014 0853   ALKPHOS 52 05/14/2016 0856   ALKPHOS 50 05/05/2014 0853   BILITOT 0.9 05/14/2016 0856   BILITOT 0.7 05/05/2014 0853   GFRNONAA >60 05/14/2016 0856   GFRNONAA >60 05/05/2014 0853   GFRAA >60 05/14/2016 0856   GFRAA >60 05/05/2014 0853    No results found for: SPEP, UPEP  Lab Results  Component Value Date   WBC 5.3 05/14/2016   NEUTROABS 3.1 05/14/2016   HGB 13.2 05/14/2016   HCT 41.3 05/14/2016   MCV 87.6 05/14/2016   PLT 238 05/14/2016      Chemistry      Component Value Date/Time   NA 135 05/14/2016 0856   NA 142 04/18/2011 1101   K 4.5 05/14/2016 0856   K 4.2 04/18/2011 1101   CL 99 (L) 05/14/2016 0856   CL 102 04/18/2011 1101   CO2 30 05/14/2016 0856   CO2 33 (H) 04/18/2011 1101   BUN 18 05/14/2016 0856   BUN 21 (H) 04/18/2011 1101   CREATININE 0.74 05/14/2016 0856   CREATININE 0.61 05/05/2014 0853      Component Value Date/Time   CALCIUM 9.3 05/14/2016 0856   CALCIUM 9.0 04/18/2011 1101   ALKPHOS 52 05/14/2016 0856   ALKPHOS 50 05/05/2014 0853   AST 16 05/14/2016 0856   AST 13 (L) 05/05/2014 0853   ALT 13 (L) 05/14/2016 0856   ALT 10 (L) 05/05/2014 0853   BILITOT 0.9 05/14/2016 0856   BILITOT 0.7 05/05/2014 0853         ASSESSMENT & PLAN:   Malignant phyllodes tumor of breast, left ([place]) #  Malignant Phyllodes  the left breast-  Status post mastectomy followed by postmastectomy radiation. No chemotherapy.  Clinically no evidence of recurrence. CBC CMP within normal limits.  # Right breast mammogram-  Inconclusive April 2017.  pt did not keep appt for further work up as reccommended. Recommend repeating mammogram asap. Pt agrees.   #  Patient follow-up with me in one year with labs or  sooner.        Cammie Sickle, MD 05/14/2016 9:52 AM

## 2016-05-14 NOTE — Assessment & Plan Note (Addendum)
#    Malignant Phyllodes  the left breast-  Status post mastectomy followed by postmastectomy radiation. No chemotherapy.  Clinically no evidence of recurrence. CBC CMP within normal limits.  # Right breast mammogram-  Inconclusive April 2017.  pt did not keep appt for further work up as reccommended. Recommend repeating mammogram asap. Pt agrees.   #  Patient follow-up with me in one year with labs or sooner.

## 2016-05-30 ENCOUNTER — Other Ambulatory Visit: Payer: Self-pay | Admitting: Internal Medicine

## 2016-05-30 ENCOUNTER — Ambulatory Visit
Admission: RE | Admit: 2016-05-30 | Discharge: 2016-05-30 | Disposition: A | Discharge status: Home / Self Care | Payer: Medicare Other | Source: Ambulatory Visit | Attending: Internal Medicine | Admitting: Internal Medicine

## 2016-05-30 DIAGNOSIS — N6310 Unspecified lump in the right breast, unspecified quadrant: Secondary | ICD-10-CM

## 2016-05-30 DIAGNOSIS — C50912 Malignant neoplasm of unspecified site of left female breast: Secondary | ICD-10-CM

## 2016-05-30 DIAGNOSIS — N6312 Unspecified lump in the right breast, upper inner quadrant: Secondary | ICD-10-CM | POA: Diagnosis not present

## 2016-05-30 DIAGNOSIS — R928 Other abnormal and inconclusive findings on diagnostic imaging of breast: Secondary | ICD-10-CM

## 2016-05-31 NOTE — Progress Notes (Signed)
[name]/sheena- please follow up with pt to make sure she keeps her appt/ for Bx- looks like she had missed previous appt-1 year ago; and let me know if Bx is abnormal Thx

## 2016-06-05 NOTE — Progress Notes (Signed)
Will follow up 

## 2017-05-13 ENCOUNTER — Inpatient Hospital Stay: Payer: Medicare Other

## 2017-05-13 ENCOUNTER — Telehealth: Payer: Self-pay | Admitting: Internal Medicine

## 2017-05-13 ENCOUNTER — Inpatient Hospital Stay: Payer: Medicare Other | Attending: Internal Medicine | Admitting: Internal Medicine

## 2017-05-13 ENCOUNTER — Encounter: Payer: Self-pay | Admitting: Internal Medicine

## 2017-05-13 ENCOUNTER — Other Ambulatory Visit: Payer: Self-pay

## 2017-05-13 VITALS — BP 202/85 | HR 64 | Temp 97.1°F | Resp 16 | Wt 218.0 lb

## 2017-05-13 DIAGNOSIS — Z170 Estrogen receptor positive status [ER+]: Secondary | ICD-10-CM | POA: Diagnosis not present

## 2017-05-13 DIAGNOSIS — C50912 Malignant neoplasm of unspecified site of left female breast: Secondary | ICD-10-CM

## 2017-05-13 DIAGNOSIS — I10 Essential (primary) hypertension: Secondary | ICD-10-CM | POA: Insufficient documentation

## 2017-05-13 DIAGNOSIS — Z923 Personal history of irradiation: Secondary | ICD-10-CM

## 2017-05-13 DIAGNOSIS — N6310 Unspecified lump in the right breast, unspecified quadrant: Secondary | ICD-10-CM

## 2017-05-13 DIAGNOSIS — N6312 Unspecified lump in the right breast, upper inner quadrant: Secondary | ICD-10-CM | POA: Diagnosis not present

## 2017-05-13 DIAGNOSIS — Z79899 Other long term (current) drug therapy: Secondary | ICD-10-CM | POA: Diagnosis not present

## 2017-05-13 DIAGNOSIS — Z853 Personal history of malignant neoplasm of breast: Secondary | ICD-10-CM | POA: Diagnosis not present

## 2017-05-13 DIAGNOSIS — Z9012 Acquired absence of left breast and nipple: Secondary | ICD-10-CM

## 2017-05-13 DIAGNOSIS — Z7982 Long term (current) use of aspirin: Secondary | ICD-10-CM | POA: Diagnosis not present

## 2017-05-13 DIAGNOSIS — N63 Unspecified lump in unspecified breast: Secondary | ICD-10-CM

## 2017-05-13 DIAGNOSIS — E039 Hypothyroidism, unspecified: Secondary | ICD-10-CM | POA: Diagnosis not present

## 2017-05-13 LAB — COMPREHENSIVE METABOLIC PANEL
ALT: 13 U/L — ABNORMAL LOW (ref 14–54)
AST: 18 U/L (ref 15–41)
Albumin: 4 g/dL (ref 3.5–5.0)
Alkaline Phosphatase: 55 U/L (ref 38–126)
Anion gap: 7 (ref 5–15)
BUN: 18 mg/dL (ref 6–20)
CHLORIDE: 102 mmol/L (ref 101–111)
CO2: 27 mmol/L (ref 22–32)
Calcium: 9.1 mg/dL (ref 8.9–10.3)
Creatinine, Ser: 0.66 mg/dL (ref 0.44–1.00)
GFR calc Af Amer: >60 mL/min (ref >60)
Glucose, Bld: 106 mg/dL — ABNORMAL HIGH (ref 65–99)
POTASSIUM: 4 mmol/L (ref 3.5–5.1)
Sodium: 136 mmol/L (ref 135–145)
Total Bilirubin: 0.5 mg/dL (ref 0.3–1.2)
Total Protein: 7.7 g/dL (ref 6.5–8.1)

## 2017-05-13 LAB — CBC WITH DIFFERENTIAL/PLATELET
Basophils Absolute: 0 10*3/uL (ref 0–0.1)
Basophils Relative: 1 %
EOS PCT: 2 %
Eosinophils Absolute: 0.1 10*3/uL (ref 0–0.7)
HEMATOCRIT: 38 % (ref 35.0–47.0)
HEMOGLOBIN: 12.4 g/dL (ref 12.0–16.0)
LYMPHS ABS: 1.3 10*3/uL (ref 1.0–3.6)
LYMPHS PCT: 27 %
MCH: 28.4 pg (ref 26.0–34.0)
MCHC: 32.7 g/dL (ref 32.0–36.0)
MCV: 86.9 fL (ref 80.0–100.0)
Monocytes Absolute: 0.4 10*3/uL (ref 0.2–0.9)
Monocytes Relative: 9 %
NEUTROS ABS: 3 10*3/uL (ref 1.4–6.5)
Neutrophils Relative %: 61 %
Platelets: 213 10*3/uL (ref 150–440)
RBC: 4.37 MIL/uL (ref 3.80–5.20)
RDW: 13.7 % (ref 11.5–14.5)
WBC: 4.8 10*3/uL (ref 3.6–11.0)

## 2017-05-13 NOTE — Assessment & Plan Note (Addendum)
#    Malignant Phyllodes  the left breast-  Status post mastectomy followed by postmastectomy radiation. No chemotherapy.  Clinically no evidence of recurrence. However, see discussion below  # Right breast mammogram-May 2018; currently up to 2 cm solid mass felt 12 o'clock position; accompanied by dimpling of the skin-concerning for malignancy;  recommended biopsy by radiology.  Patient did not follow-up. Recommend follow up again at this time.  Patient reluctantly agrees to follow-up  # Elevated systolic 161/09; but at home 130-140s; recommend close monit  #  Patient follow-up with me in 2 month/ no labs. Left message for [name].

## 2017-05-13 NOTE — Progress Notes (Signed)
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  Patient was found to have asymmetry of the right breast mammogram origin in 2017; for which further workup which she initially declined.  However finally in May 2018 she had a mammogram/ultrasound that showed approximately centimeter-sized distortion right breast 1230 position-for which a biopsy was recommended.  Patient states that she was unaware of the recommendation for the biopsy.  Denies any unusual shortness of breath or cough. No fever or chills.  Appetite is good. Chronic back pain. Not any worse.  REVIEW OF SYSTEMS:  A complete 10 point review of system is done which is negative except mentioned above/history of present illness.   PAST MEDICAL HISTORY :  Past Medical History:  Diagnosis Date  . Breast cancer ([place]) 03/27/10   lt mastectomy/radiation  . History of radiation therapy   . Hypertension   . Hypothyroidism   . Umbilical hernia     PAST SURGICAL HISTORY :   Past Surgical History:  Procedure Laterality Date  . HERNIA REPAIR    . MASTECTOMY Left 06/19/2010   had radiation    FAMILY HISTORY :   Family History  Problem Relation Age of Onset  . Hypertension Father   . Hypertension Sister   . Diabetes Sister   . Breast cancer Neg Hx     SOCIAL HISTORY:   Social History   Tobacco Use  . Smoking status: Never Smoker  . Smokeless tobacco: Never Used  Substance Use Topics  .  Alcohol use: No    Alcohol/week: 0.0 oz  . Drug use: No    ALLERGIES:  is allergic to aspartame; nickel; orange oil; and yellow dye.  MEDICATIONS:  Current Outpatient Medications  Medication Sig Dispense Refill  . aspirin EC 81 MG tablet Take 1 tablet by mouth daily.    . Cholecalciferol 4000 units CAPS Take 1 tablet by mouth daily.    Marland Kitchen levothyroxine (SYNTHROID, LEVOTHROID) 50 MCG tablet Take 1 tablet by mouth daily.    Marland Kitchen lisinopril (PRINIVIL,ZESTRIL) 10 MG tablet Take 10 mg by mouth.    . torsemide (DEMADEX) 5 MG tablet Take 5 mg by mouth daily.     No current facility-administered medications for this visit.     PHYSICAL EXAMINATION: ECOG PERFORMANCE STATUS: 0 - Asymptomatic  BP (!) 202/85 (BP Location: Right Arm, Patient Position: Sitting)   Pulse 64   Temp (!) 97.1 F (36.2 C) (Tympanic)   Resp 16   Wt 218 lb (98.9 kg)   BMI 35.19 kg/m   Filed Weights   05/13/17 1033 05/13/17 1040  Weight: 218 lb 12.9 oz (99.2 kg) 218 lb (98.9 kg)    GENERAL: Well-nourished well-developed; Alert, no distress and comfortable.  Obese. Alone.  EYES: no pallor or icterus OROPHARYNX: no thrush or ulceration; good dentition  NECK: supple, no masses felt LYMPH:  no palpable lymphadenopathy in the cervical, axillary or inguinal regions LUNGS: clear to  auscultation and  No wheeze or crackles HEART/CVS: regular rate & rhythm and no murmurs; No lower extremity edema ABDOMEN:abdomen soft, non-tender and normal bowel sounds Musculoskeletal:no cyanosis of digits and no clubbing  PSYCH: alert & oriented x 3 with fluent speech NEURO: no focal motor/sensory deficits SKIN:  no rashes or significant lesions Right and left BREAST exam [in the presence of nurse]- no unusual skin changes or dominant masses felt. Surgical scars noted-  Left mastectomy changes. Right breast no dominant masses felt.   LABORATORY DATA:  I have reviewed the data as listed    Component Value Date/Time   NA 136  05/13/2017 1003   NA 142 04/18/2011 1101   K 4.0 05/13/2017 1003   K 4.2 04/18/2011 1101   CL 102 05/13/2017 1003   CL 102 04/18/2011 1101   CO2 27 05/13/2017 1003   CO2 33 (H) 04/18/2011 1101   GLUCOSE 106 (H) 05/13/2017 1003   GLUCOSE 118 (H) 04/18/2011 1101   BUN 18 05/13/2017 1003   BUN 21 (H) 04/18/2011 1101   CREATININE 0.66 05/13/2017 1003   CREATININE 0.61 05/05/2014 0853   CALCIUM 9.1 05/13/2017 1003   CALCIUM 9.0 04/18/2011 1101   PROT 7.7 05/13/2017 1003   PROT 7.3 05/05/2014 0853   ALBUMIN 4.0 05/13/2017 1003   ALBUMIN 4.2 05/05/2014 0853   AST 18 05/13/2017 1003   AST 13 (L) 05/05/2014 0853   ALT 13 (L) 05/13/2017 1003   ALT 10 (L) 05/05/2014 0853   ALKPHOS 55 05/13/2017 1003   ALKPHOS 50 05/05/2014 0853   BILITOT 0.5 05/13/2017 1003   BILITOT 0.7 05/05/2014 0853   GFRNONAA >60 05/13/2017 1003   GFRNONAA >60 05/05/2014 0853   GFRAA >60 05/13/2017 1003   GFRAA >60 05/05/2014 0853    No results found for: SPEP, UPEP  Lab Results  Component Value Date   WBC 4.8 05/13/2017   NEUTROABS 3.0 05/13/2017   HGB 12.4 05/13/2017   HCT 38.0 05/13/2017   MCV 86.9 05/13/2017   PLT 213 05/13/2017      Chemistry      Component Value Date/Time   NA 136 05/13/2017 1003   NA 142 04/18/2011 1101   K 4.0 05/13/2017 1003   K 4.2 04/18/2011 1101   CL 102 05/13/2017 1003   CL 102 04/18/2011 1101   CO2 27 05/13/2017 1003   CO2 33 (H) 04/18/2011 1101   BUN 18 05/13/2017 1003   BUN 21 (H) 04/18/2011 1101   CREATININE 0.66 05/13/2017 1003   CREATININE 0.61 05/05/2014 0853      Component Value Date/Time   CALCIUM 9.1 05/13/2017 1003   CALCIUM 9.0 04/18/2011 1101   ALKPHOS 55 05/13/2017 1003   ALKPHOS 50 05/05/2014 0853   AST 18 05/13/2017 1003   AST 13 (L) 05/05/2014 0853   ALT 13 (L) 05/13/2017 1003   ALT 10 (L) 05/05/2014 0853   BILITOT 0.5 05/13/2017 1003   BILITOT 0.7 05/05/2014 0853      Targeted ultrasound is performed, showing a 0.9 x 1.1 x 1.1  cm irregular hypoechoic mass at the 12:30 position of the right breast 2 cm from the nipple.  No abnormal right axillary lymph nodes are identified.  IMPRESSION: Suspicious 1.1 cm mass within the upper inner right breast. This likely represents the distortion identified mammographically and tissue sampling is recommended.  RECOMMENDATION: Ultrasound-guided right breast biopsy, which will be arranged.  I have discussed the findings and recommendations with the patient. Results were also  provided in writing at the conclusion of the visit. If applicable, a reminder letter will be sent to the patient regarding the next appointment.  BI-RADS CATEGORY  4: Suspicious.   Electronically Signed   By: Margarette Canada M.D.   On: 05/30/2016 16:17   ASSESSMENT & PLAN:   Malignant phyllodes tumor of breast, left ([place]) #  Malignant Phyllodes  the left breast-  Status post mastectomy followed by postmastectomy radiation. No chemotherapy.  Clinically no evidence of recurrence. However, see discussion below  # Right breast mammogram-May 2018; currently up to 2 cm solid mass felt 12 o'clock position; accompanied by dimpling of the skin-concerning for malignancy;  recommended biopsy by radiology.  Patient did not follow-up. Recommend follow up again at this time.  Patient reluctantly agrees to follow-up  # Elevated systolic 354/56; but at home 130-140s; recommend close monit  #  Patient follow-up with me in 2 month/ no labs. Left message for [name].        Cammie Sickle, MD 05/13/2017 1:35 PM

## 2017-05-13 NOTE — Progress Notes (Signed)
  Oncology Nurse Navigator Documentation  Navigator Location: [facility] (05/13/17 1500)   )Navigator Encounter Type: Telephone;Introductory phone call;Follow-up Appt (05/13/17 1500)                     Patient Visit Type: Follow-up (05/13/17 1500)       Interventions: Coordination of Care (05/13/17 1500)                      Time Spent with Patient: 30 (05/13/17 1500)   Patient with history of left breast cancer, and mastectomy, referred biopsy of right breast mass finding in May 2018.  Patient did not follow-up.  Per Dr. Rogue Bussing, patient to be scheduled for right breast diagnostic mammogram, and ultrasound, and surgical consult.  Patient would like to wait until after mammogram to have surgical consult scheduled.  Mammogram scheduled for 06/06/17 at 2:00.   Will schedule with surgeon at that time.

## 2017-05-13 NOTE — Telephone Encounter (Signed)
[name]/[name]-patient was found to have a abnormal mammogram/US May 2018/and recommended a biopsy as per the recommendations from the radiologist.  Patient denies being aware of the recommendation.   This needs urgent follow-up/please contact [facility] re: next diagnostic plan/ and also contact patient. Please also refer her to surgery.   GB

## 2017-05-14 NOTE — Telephone Encounter (Signed)
Patient is scheduled for  diagnostic mammogram and ultrasound on Jun 06, 2017 at 2:00.  She did not want to schedule surgical consult until after mammogram.  Will follow-up after mammogram.

## 2017-06-06 ENCOUNTER — Other Ambulatory Visit: Payer: Medicare Other

## 2017-07-08 ENCOUNTER — Inpatient Hospital Stay: Payer: Medicare Other | Admitting: Nurse Practitioner

## 2017-07-22 ENCOUNTER — Encounter: Payer: Self-pay | Admitting: Internal Medicine

## 2017-07-22 ENCOUNTER — Inpatient Hospital Stay: Payer: Medicare Other | Attending: Internal Medicine | Admitting: Internal Medicine

## 2017-07-22 VITALS — BP 167/68 | HR 66 | Temp 97.3°F | Resp 16 | Wt 219.1 lb

## 2017-07-22 DIAGNOSIS — C50912 Malignant neoplasm of unspecified site of left female breast: Secondary | ICD-10-CM | POA: Diagnosis not present

## 2017-07-22 DIAGNOSIS — Z9012 Acquired absence of left breast and nipple: Secondary | ICD-10-CM | POA: Diagnosis not present

## 2017-07-22 DIAGNOSIS — N6312 Unspecified lump in the right breast, upper inner quadrant: Secondary | ICD-10-CM | POA: Insufficient documentation

## 2017-07-22 DIAGNOSIS — Z923 Personal history of irradiation: Secondary | ICD-10-CM | POA: Insufficient documentation

## 2017-07-22 DIAGNOSIS — I10 Essential (primary) hypertension: Secondary | ICD-10-CM | POA: Insufficient documentation

## 2017-07-22 DIAGNOSIS — Z853 Personal history of malignant neoplasm of breast: Secondary | ICD-10-CM | POA: Diagnosis present

## 2017-07-22 NOTE — Assessment & Plan Note (Addendum)
#    Malignant Phyllodes  the left breast-  Status post mastectomy followed by postmastectomy radiation.  Clinically no evidence of recurrence.  Stable.  #Right breast abnormality at 2 o'clock position since May 2018; patient has missed multiple appointments that have been scheduled for her.  Again reminded the patient importance of having a biopsy done.  Patient reluctantly agrees; will reorder mammogram/biopsy.  #Elevated blood pressure-167/68; recommend close monitoring; compliance with medications.  # agrees with mammo ASAP  # follow up in 2 weeks/no labs

## 2017-07-22 NOTE — Progress Notes (Signed)
[facility] OFFICE PROGRESS NOTE  Patient Care Team: Valera Castle, MD as PCP - General (Family Medicine)  Cancer Staging No matching staging information was found for the patient.   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  female pleasant patient above history phyllodes tumor of the breast; also right-sided recent abnormality of the breast is here for follow-up.  Patient did not keep her appointment with radiology/mammography as recommended for the right breast abnormality.  She denies any worsening lung problem.  Has chronic back pain not any worse.  Review of Systems  Constitutional: Negative for chills, diaphoresis, fever, malaise/fatigue and weight loss.  HENT: Negative for nosebleeds and sore throat.   Eyes: Negative for double vision.  Respiratory: Negative for cough, hemoptysis, sputum production, shortness of breath and wheezing.   Cardiovascular: Negative for chest pain, palpitations, orthopnea and leg swelling.  Gastrointestinal: Negative for abdominal pain, blood in stool, constipation, diarrhea, heartburn, melena, nausea and vomiting.  Genitourinary: Negative for dysuria, frequency and urgency.  Musculoskeletal: Positive for back pain and joint pain.  Skin: Negative.  Negative for itching and rash.  Neurological: Negative for dizziness, tingling, focal weakness, weakness and headaches.  Endo/Heme/Allergies: Does not bruise/bleed easily.  Psychiatric/Behavioral: Negative for depression. The patient is not nervous/anxious and does not have insomnia.       PAST MEDICAL HISTORY :  Past Medical History:  Diagnosis Date  . Breast cancer ([place]) 03/27/10   lt mastectomy/radiation   . History of radiation therapy   . Hypertension   . Hypothyroidism   . Umbilical hernia     PAST SURGICAL HISTORY :   Past Surgical History:  Procedure Laterality Date  . HERNIA REPAIR    . MASTECTOMY Left 06/19/2010   had radiation    FAMILY HISTORY :   Family History  Problem Relation Age of Onset  . Hypertension Father   . Hypertension Sister   . Diabetes Sister   . Breast cancer Neg Hx     SOCIAL HISTORY:   Social History   Tobacco Use  . Smoking status: Never Smoker  . Smokeless tobacco: Never Used  Substance Use Topics  . Alcohol use: No    Alcohol/week: 0.0 oz  . Drug use: No    ALLERGIES:  is allergic to aspartame; nickel; penicillins; orange oil; sheep allergy; and yellow dye.  MEDICATIONS:  Current Outpatient Medications  Medication Sig Dispense Refill  . aspirin EC 81 MG tablet Take 1 tablet by mouth daily.    . Cholecalciferol 4000 units CAPS Take 1 tablet by mouth daily.    Marland Kitchen levothyroxine (SYNTHROID, LEVOTHROID) 50 MCG tablet Take 1 tablet by mouth daily.    Marland Kitchen lisinopril (PRINIVIL,ZESTRIL) 10 MG tablet Take 10 mg by mouth.    . torsemide (DEMADEX) 5 MG tablet Take 5 mg by mouth daily.     No current facility-administered medications for this visit.     PHYSICAL EXAMINATION: ECOG PERFORMANCE STATUS: 0 - Asymptomatic  BP (!) 167/68 (BP Location: Left Arm, Patient Position: Sitting)   Pulse 66   Temp (!) 97.3 F (36.3 C) (Tympanic)   Resp 16   Wt 219 lb 2.2 oz (  99.4 kg)   BMI 35.37 kg/m   Filed Weights   07/22/17 1040  Weight: 219 lb 2.2 oz (99.4 kg)    GENERAL: Well-nourished well-developed; Alert, no distress and comfortable.  Alone. EYES: no pallor or icterus OROPHARYNX: no thrush or ulceration; NECK: supple; no lymph nodes felt. LYMPH:  no palpable lymphadenopathy in the axillary or inguinal regions LUNGS: Decreased breath sounds auscultation bilaterally. No wheeze or crackles HEART/CVS: regular rate & rhythm and no murmurs; No  lower extremity edema ABDOMEN:abdomen soft, non-tender and normal bowel sounds. No hepatomegaly or splenomegaly.  Musculoskeletal:no cyanosis of digits and no clubbing  PSYCH: alert & oriented x 3 with fluent speech NEURO: no focal motor/sensory deficits SKIN:  no rashes or significant lesions.  Declines breast exam.    LABORATORY DATA:  I have reviewed the data as listed    Component Value Date/Time   NA 136 05/13/2017 1003   NA 142 04/18/2011 1101   K 4.0 05/13/2017 1003   K 4.2 04/18/2011 1101   CL 102 05/13/2017 1003   CL 102 04/18/2011 1101   CO2 27 05/13/2017 1003   CO2 33 (H) 04/18/2011 1101   GLUCOSE 106 (H) 05/13/2017 1003   GLUCOSE 118 (H) 04/18/2011 1101   BUN 18 05/13/2017 1003   BUN 21 (H) 04/18/2011 1101   CREATININE 0.66 05/13/2017 1003   CREATININE 0.61 05/05/2014 0853   CALCIUM 9.1 05/13/2017 1003   CALCIUM 9.0 04/18/2011 1101   PROT 7.7 05/13/2017 1003   PROT 7.3 05/05/2014 0853   ALBUMIN 4.0 05/13/2017 1003   ALBUMIN 4.2 05/05/2014 0853   AST 18 05/13/2017 1003   AST 13 (L) 05/05/2014 0853   ALT 13 (L) 05/13/2017 1003   ALT 10 (L) 05/05/2014 0853   ALKPHOS 55 05/13/2017 1003   ALKPHOS 50 05/05/2014 0853   BILITOT 0.5 05/13/2017 1003   BILITOT 0.7 05/05/2014 0853   GFRNONAA >60 05/13/2017 1003   GFRNONAA >60 05/05/2014 0853   GFRAA >60 05/13/2017 1003   GFRAA >60 05/05/2014 0853    No results found for: SPEP, UPEP  Lab Results  Component Value Date   WBC 4.8 05/13/2017   NEUTROABS 3.0 05/13/2017   HGB 12.4 05/13/2017   HCT 38.0 05/13/2017   MCV 86.9 05/13/2017   PLT 213 05/13/2017      Chemistry      Component Value Date/Time   NA 136 05/13/2017 1003   NA 142 04/18/2011 1101   K 4.0 05/13/2017 1003   K 4.2 04/18/2011 1101   CL 102 05/13/2017 1003   CL 102 04/18/2011 1101   CO2 27 05/13/2017 1003   CO2 33 (H) 04/18/2011 1101   BUN 18 05/13/2017 1003   BUN 21 (H) 04/18/2011 1101   CREATININE 0.66 05/13/2017 1003   CREATININE  0.61 05/05/2014 0853      Component Value Date/Time   CALCIUM 9.1 05/13/2017 1003   CALCIUM 9.0 04/18/2011 1101   ALKPHOS 55 05/13/2017 1003   ALKPHOS 50 05/05/2014 0853   AST 18 05/13/2017 1003   AST 13 (L) 05/05/2014 0853   ALT 13 (L) 05/13/2017 1003   ALT 10 (L) 05/05/2014 0853   BILITOT 0.5 05/13/2017 1003   BILITOT 0.7 05/05/2014 0853       RADIOGRAPHIC STUDIES: I have personally reviewed the radiological images as listed and agreed with the findings in the report. No results found.   ASSESSMENT & PLAN:  Malignant phyllodes tumor of breast, left ([place]) #  Malignant Phyllodes  the left breast-  Status post mastectomy followed by postmastectomy radiation.  Clinically no evidence of recurrence.  Stable.  #Right breast abnormality at 2 o'clock position since May 2018; patient has missed multiple appointments that have been scheduled for her.  Again reminded the patient importance of having a biopsy done.  Patient reluctantly agrees; will reorder mammogram/biopsy.  #Elevated blood pressure-167/68; recommend close monitoring; compliance with medications.  # agrees with mammo ASAP  # follow up in 2 weeks/no labs   No orders of the defined types were placed in this encounter.  All questions were answered. The patient knows to call the clinic with any problems, questions or concerns.      Cammie Sickle, MD 07/27/2017 2:27 PM

## 2017-08-05 ENCOUNTER — Ambulatory Visit
Admission: RE | Admit: 2017-08-05 | Discharge: 2017-08-05 | Disposition: A | Discharge status: Home / Self Care | Payer: Medicare Other | Source: Ambulatory Visit | Attending: Internal Medicine | Admitting: Internal Medicine

## 2017-08-05 DIAGNOSIS — N6310 Unspecified lump in the right breast, unspecified quadrant: Secondary | ICD-10-CM | POA: Insufficient documentation

## 2017-08-06 ENCOUNTER — Other Ambulatory Visit: Payer: Self-pay | Admitting: Internal Medicine

## 2017-08-06 DIAGNOSIS — N6310 Unspecified lump in the right breast, unspecified quadrant: Secondary | ICD-10-CM

## 2017-08-06 DIAGNOSIS — R928 Other abnormal and inconclusive findings on diagnostic imaging of breast: Secondary | ICD-10-CM

## 2017-08-09 ENCOUNTER — Telehealth: Payer: Self-pay | Admitting: Internal Medicine

## 2017-08-09 NOTE — Telephone Encounter (Signed)
[name]/[name]-unfortunately as expected, patient has a breast mass highly suspicious for malignancy.  Patient had declined biopsy as per the radiologist.  I would appreciate if 1 of you is available to meet with the patient when she sees me on on July 16 at 11 in [place].  Unfortunately patient has been reluctant to my recommendations also.  Thx GB

## 2017-08-12 ENCOUNTER — Telehealth: Payer: Self-pay | Admitting: Internal Medicine

## 2017-08-12 ENCOUNTER — Inpatient Hospital Stay: Payer: Medicare Other | Admitting: Internal Medicine

## 2017-08-12 NOTE — Telephone Encounter (Signed)
FYI- I left message for pt's PCP, Dr.Olmedo to discuss pt's care given multiple refusals with breast Biopsy.  Pt was no show in the clinic today.  Dr.B

## 2017-08-13 ENCOUNTER — Telehealth: Payer: Self-pay | Admitting: Internal Medicine

## 2017-08-13 NOTE — Telephone Encounter (Signed)
I Spoke to patient's PCP Dr. Kym Groom; given our inability to convince the patient to have a breast biopsy done. Dr.Olmedo kindly agrees that his office will try to contact the patient to follow up re: breast ca work up.  # Heather- please send a registered letter to effect that pt needs to contact us re: breast ca work up/Bx  Etc.  Thanks,  GB

## 2017-08-19 ENCOUNTER — Ambulatory Visit: Admission: RE | Admit: 2017-08-19 | Payer: Medicare Other | Source: Ambulatory Visit

## 2017-09-02 ENCOUNTER — Inpatient Hospital Stay: Payer: Medicare Other | Attending: Internal Medicine | Admitting: Internal Medicine

## 2017-09-02 VITALS — BP 214/74 | HR 67 | Temp 97.2°F | Resp 16 | Wt 220.2 lb

## 2017-09-02 DIAGNOSIS — Z923 Personal history of irradiation: Secondary | ICD-10-CM | POA: Diagnosis not present

## 2017-09-02 DIAGNOSIS — Z853 Personal history of malignant neoplasm of breast: Secondary | ICD-10-CM | POA: Insufficient documentation

## 2017-09-02 DIAGNOSIS — I10 Essential (primary) hypertension: Secondary | ICD-10-CM | POA: Insufficient documentation

## 2017-09-02 DIAGNOSIS — C50912 Malignant neoplasm of unspecified site of left female breast: Secondary | ICD-10-CM

## 2017-09-02 DIAGNOSIS — C50211 Malignant neoplasm of upper-inner quadrant of right female breast: Secondary | ICD-10-CM | POA: Diagnosis present

## 2017-09-02 DIAGNOSIS — Z170 Estrogen receptor positive status [ER+]: Secondary | ICD-10-CM | POA: Insufficient documentation

## 2017-09-02 DIAGNOSIS — Z9012 Acquired absence of left breast and nipple: Secondary | ICD-10-CM | POA: Diagnosis not present

## 2017-09-02 DIAGNOSIS — N6312 Unspecified lump in the right breast, upper inner quadrant: Secondary | ICD-10-CM | POA: Diagnosis not present

## 2017-09-02 NOTE — Progress Notes (Signed)
Brenda Cain  Patient Care Team: Valera Castle, MD as PCP - General (Family Medicine)  Cancer Staging No matching staging information was found for the patient.   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  Brenda Cain 84 y.o.  female pleasant patient above history of left breast malignant phyllodes; and more recently right breast lump/radiologic concerning for malignancy is here for follow-up.  Patient was initially noted to have a right breast abnormality approximately year ago; she did not follow-up.  Finally after multiple reminders patient did go for a follow-up diagnostic mammogram/breast ultrasound that showed-approximately 2 cm right breast mass highly concerning for malignancy.  Patient was recommended biopsy she declined.  Review of Systems  Constitutional: Negative for chills, diaphoresis, fever, malaise/fatigue and weight loss.  HENT: Negative for nosebleeds and sore throat.   Eyes: Negative for double vision.  Respiratory: Negative for cough, hemoptysis, sputum production, shortness of breath and wheezing.   Cardiovascular: Negative for chest pain, palpitations, orthopnea and leg swelling.  Gastrointestinal: Negative for abdominal pain, blood in stool, constipation, diarrhea, heartburn, melena, nausea and vomiting.  Genitourinary: Negative for dysuria, frequency and urgency.  Musculoskeletal: Negative for back pain and joint pain.  Skin: Negative.  Negative for itching and rash.  Neurological: Negative for dizziness, tingling, focal weakness, weakness and headaches.  Endo/Heme/Allergies: Does not bruise/bleed easily.  Psychiatric/Behavioral: Negative  for depression. The patient is not nervous/anxious and does not have insomnia.       PAST MEDICAL HISTORY :  Past Medical History:  Diagnosis Date  . Breast cancer ([place]) 03/27/10   lt mastectomy/radiation  . History of radiation therapy   . Hypertension   . Hypothyroidism   . Umbilical hernia     PAST SURGICAL HISTORY :   Past Surgical History:  Procedure Laterality Date  . HERNIA REPAIR    . MASTECTOMY Left 06/19/2010   had radiation    FAMILY HISTORY :   Family History  Problem Relation Age of Onset  . Hypertension Father   . Hypertension Sister   . Diabetes Sister   . Breast cancer Neg Hx     SOCIAL HISTORY:   Social History   Tobacco Use  . Smoking status: Never Smoker  . Smokeless tobacco: Never Used  Substance Use Topics  . Alcohol use: No    Alcohol/week: 0.0 standard drinks  . Drug use: No    ALLERGIES:  is allergic to aspartame; nickel; penicillins; orange oil; sheep allergy; and yellow dye.  MEDICATIONS:  Current Outpatient Medications  Medication Sig Dispense Refill  . aspirin EC 81 MG tablet Take 1 tablet by mouth daily.    . Cholecalciferol 4000 units CAPS Take 1 tablet by mouth daily.    Marland Kitchen levothyroxine (SYNTHROID, LEVOTHROID) 50 MCG tablet Take 1 tablet by mouth daily.    Marland Kitchen lisinopril (PRINIVIL,ZESTRIL) 10 MG tablet Take 10 mg by mouth.    . torsemide (DEMADEX) 5 MG tablet Take 5 mg by mouth daily.     No current facility-administered medications for this visit.     PHYSICAL EXAMINATION: ECOG PERFORMANCE STATUS: 0 - Asymptomatic  BP (!) 214/74 (BP Location: Left Arm,  Patient Position: Sitting)   Pulse 67   Temp (!) 97.2 F (36.2 C) (Tympanic)   Resp 16   Wt 220 lb 3.8 oz (99.9 kg)   BMI 35.55 kg/m   Filed Weights   09/02/17 0935  Weight: 220 lb 3.8 oz (99.9 kg)    GENERAL: Well-nourished well-developed; Alert, no distress and comfortable.  Alone. EYES: no pallor or icterus OROPHARYNX: no thrush or ulceration; NECK: supple; no  lymph nodes felt. LYMPH:  no palpable lymphadenopathy in the axillary or inguinal regions LUNGS: Decreased breath sounds auscultation bilaterally. No wheeze or crackles HEART/CVS: regular rate & rhythm and no murmurs; No lower extremity edema ABDOMEN:abdomen soft, non-tender and normal bowel sounds. No hepatomegaly or splenomegaly.  Musculoskeletal:no cyanosis of digits and no clubbing  PSYCH: alert & oriented x 3 with fluent speech NEURO: no focal motor/sensory deficits SKIN:  no rashes or significant lesions declines a breast exam.    LABORATORY DATA:  I have reviewed the data as listed    Component Value Date/Time   NA 136 05/13/2017 1003   NA 142 04/18/2011 1101   K 4.0 05/13/2017 1003   K 4.2 04/18/2011 1101   CL 102 05/13/2017 1003   CL 102 04/18/2011 1101   CO2 27 05/13/2017 1003   CO2 33 (H) 04/18/2011 1101   GLUCOSE 106 (H) 05/13/2017 1003   GLUCOSE 118 (H) 04/18/2011 1101   BUN 18 05/13/2017 1003   BUN 21 (H) 04/18/2011 1101   CREATININE 0.66 05/13/2017 1003   CREATININE 0.61 05/05/2014 0853   CALCIUM 9.1 05/13/2017 1003   CALCIUM 9.0 04/18/2011 1101   PROT 7.7 05/13/2017 1003   PROT 7.3 05/05/2014 0853   ALBUMIN 4.0 05/13/2017 1003   ALBUMIN 4.2 05/05/2014 0853   AST 18 05/13/2017 1003   AST 13 (L) 05/05/2014 0853   ALT 13 (L) 05/13/2017 1003   ALT 10 (L) 05/05/2014 0853   ALKPHOS 55 05/13/2017 1003   ALKPHOS 50 05/05/2014 0853   BILITOT 0.5 05/13/2017 1003   BILITOT 0.7 05/05/2014 0853   GFRNONAA >60 05/13/2017 1003   GFRNONAA >60 05/05/2014 0853   GFRAA >60 05/13/2017 1003   GFRAA >60 05/05/2014 0853    No results found for: SPEP, UPEP  Lab Results  Component Value Date   WBC 4.8 05/13/2017   NEUTROABS 3.0 05/13/2017   HGB 12.4 05/13/2017   HCT 38.0 05/13/2017   MCV 86.9 05/13/2017   PLT 213 05/13/2017      Chemistry      Component Value Date/Time   NA 136 05/13/2017 1003   NA 142 04/18/2011 1101   K 4.0 05/13/2017 1003   K 4.2  04/18/2011 1101   CL 102 05/13/2017 1003   CL 102 04/18/2011 1101   CO2 27 05/13/2017 1003   CO2 33 (H) 04/18/2011 1101   BUN 18 05/13/2017 1003   BUN 21 (H) 04/18/2011 1101   CREATININE 0.66 05/13/2017 1003   CREATININE 0.61 05/05/2014 0853      Component Value Date/Time   CALCIUM 9.1 05/13/2017 1003   CALCIUM 9.0 04/18/2011 1101   ALKPHOS 55 05/13/2017 1003   ALKPHOS 50 05/05/2014 0853   AST 18 05/13/2017 1003   AST 13 (L) 05/05/2014 0853   ALT 13 (L) 05/13/2017 1003   ALT 10 (L) 05/05/2014 0853   BILITOT 0.5 05/13/2017 1003   BILITOT 0.7 05/05/2014 0853       RADIOGRAPHIC STUDIES: I have personally reviewed the radiological images as listed and  agreed with the findings in the report. No results found.   ASSESSMENT & PLAN:  Malignant phyllodes tumor of breast, left ([place]) #  Malignant Phyllodes  the left breast-  Status post mastectomy followed by postmastectomy radiation.  Clinically no evidence of recurrence stable.  #Right breast abnormality at 2 o'clock position since May 2018; worse.  Confirmed on recent diagnostic mammogram/ultrasound.  Patient was offered biopsy by the radiologist; patient declined.  Today after further discussion patient is agreeable for biopsy.  I did discuss evaluation with general surgery for further evaluation and recommendations.  #Elevated blood pressure-214/78; worse.  I discussed my concerns for stroke and other thrombo-hemorrhagic events if not well controlled.recommend close monitoring; compliance with medications.   # follow up in 3 weeks.  Also discussed with [facility] breast navigator.  Cc: Dr.Olemdo/Sheean.    Orders Placed This Encounter  Procedures  . Ambulatory referral to General Surgery    Referral Priority:   Routine    Referral Type:   Surgical    Referral Reason:   Specialty Services Required    Referred to Provider:   Jules Husbands, MD    Requested Specialty:   General Surgery    Number of Visits Requested:   1   All  questions were answered. The patient knows to call the clinic with any problems, questions or concerns.      Cammie Sickle, MD 09/04/2017 5:52 PM

## 2017-09-02 NOTE — Assessment & Plan Note (Addendum)
#    Malignant Phyllodes  the left breast-  Status post mastectomy followed by postmastectomy radiation.  Clinically no evidence of recurrence stable.  #Right breast abnormality at 2 o'clock position since May 2018; worse.  Confirmed on recent diagnostic mammogram/ultrasound.  Patient was offered biopsy by the radiologist; patient declined.  Today after further discussion patient is agreeable for biopsy.  I did discuss evaluation with general surgery for further evaluation and recommendations.  #Elevated blood pressure-214/78; worse.  I discussed my concerns for stroke and other thrombo-hemorrhagic events if not well controlled.recommend close monitoring; compliance with medications.   # follow up in 3 weeks.  Also discussed with [facility] breast navigator.  Cc: Dr.Olemdo/Sheean.

## 2017-09-08 ENCOUNTER — Encounter: Payer: Self-pay | Admitting: Surgery

## 2017-09-08 ENCOUNTER — Ambulatory Visit (INDEPENDENT_AMBULATORY_CARE_PROVIDER_SITE_OTHER): Payer: Medicare Other | Admitting: Surgery

## 2017-09-08 VITALS — BP 195/93 | HR 69 | Temp 98.2°F | Wt 218.0 lb

## 2017-09-08 DIAGNOSIS — N6310 Unspecified lump in the right breast, unspecified quadrant: Secondary | ICD-10-CM

## 2017-09-08 NOTE — Progress Notes (Signed)
Patient ID: Brenda Cain, female   DOB: Dec 31, 1933, 84 y.o.   MRN: [id number]  HPI Brenda Cain is a 84 y.o. female seen in consultation at the request of Dr. Rogue Bussing.  She did have a history of Malignant phyllodes tumor on the left breast requiring mastectomy by Dr. Pat Patrick 2012. Required XRT, no chemo.  Since 2018 she is found to have a 2 cm solid mass at 12:00 concerning for cancer.  I have personally review the mammograms confirming this assessment.  Unfortunately has been a challenge to obtain good compliance of the patient.  She has canceled multiple appointments for biopsies.  She reports no pain no breast discharge.   HPI  Past Medical History:  Diagnosis Date  . Breast cancer ([place]) 03/27/10   lt mastectomy/radiation  . History of radiation therapy   . Hypertension   . Hypothyroidism   . Umbilical hernia     Past Surgical History:  Procedure Laterality Date  . HERNIA REPAIR    . MASTECTOMY Left 06/19/2010   had radiation    Family History  Problem Relation Age of Onset  . Hypertension Father   . Hypertension Sister   . Diabetes Sister   . Breast cancer Neg Hx     Social History Social History   Tobacco Use  . Smoking status: Never Smoker  . Smokeless tobacco: Never Used  Substance Use Topics  . Alcohol use: No    Alcohol/week: 0.0 standard drinks  . Drug use: No    Allergies  Allergen Reactions  . Aspartame Hives, Itching and Swelling  . Nickel Hives and Swelling  . Penicillins Hives and Swelling  . Orange Oil Dermatitis    Patient states she can have oranges but not orange juice. Patient states she can have oranges but not orange juice. Patient states she can have oranges but not orange juice.   . Sheep Allergy Itching    Patient is allergic to wool. Patient is allergic to wool.   . Yellow Dye Rash    Current Outpatient Medications  Medication Sig Dispense Refill  . aspirin EC 81 MG tablet Take 1 tablet by mouth daily.    . Cholecalciferol 4000  units CAPS Take 1 tablet by mouth daily.    . clonazePAM (KLONOPIN) 0.5 MG tablet Take by mouth.    . levothyroxine (SYNTHROID, LEVOTHROID) 50 MCG tablet Take 1 tablet by mouth daily.    Marland Kitchen lisinopril (PRINIVIL,ZESTRIL) 10 MG tablet Take 10 mg by mouth.    . torsemide (DEMADEX) 5 MG tablet Take 5 mg by mouth daily.     No current facility-administered medications for this visit.      Review of Systems Full ROS  was asked and was negative except for the information on the HPI  Physical Exam Blood pressure (!) 195/93, pulse 69, temperature 98.2 F (36.8 C), temperature source Oral, weight 218 lb (98.9 kg). CONSTITUTIONAL: NAD EYES: Pupils are equal, round, and reactive to light, Sclera are non-icteric. EARS, NOSE, MOUTH AND THROAT: The oropharynx is clear. The oral mucosa is pink and moist. Hearing is intact to voice. LYMPH NODES:  Lymph nodes in the neck are normal. RESPIRATORY:  Lungs are clear. There is normal respiratory effort, with equal breath sounds bilaterally, and without pathologic use of accessory muscles. CARDIOVASCULAR: Heart is regular without murmurs, gallops, or rubs. BREAST: 3 cm mass w skin dimple on the Right breast. Mobile but hard. Located 12 oclock. 3 cm form nipple. GI: The abdomen is  soft, nontender, and nondistended. There are no palpable masses. There is no hepatosplenomegaly. There are normal bowel sounds in all quadrants. GU: Rectal deferred.   MUSCULOSKELETAL: Normal muscle strength and tone. No cyanosis or edema.   SKIN: Turgor is good and there are no pathologic skin lesions or ulcers. NEUROLOGIC: Motor and sensation is grossly normal. Cranial nerves are grossly intact. PSYCH:  Oriented to person, place and time. Affect is normal.  Data Reviewed  I have personally reviewed the patient's imaging, laboratory findings and medical records.    Assessment/Plan 84 year old with newly diagnosed right breast mass for over a year in need for biopsy.  Discussed  with the patient detail about the importance of obtaining tissue diagnosis.  Now she is in agreement with ultrasound-guided core needle biopsy.  We will arrange for biopsy.  I will see her back in a couple weeks after we have the results from the biopsy.  A copy of this report will be sent to the referring provider   Caroleen Hamman, MD FACS General Surgeon 09/08/2017, 4:09 PM

## 2017-09-08 NOTE — Patient Instructions (Signed)
[facility] will be calling you to schedule your breast biopsy. Please answer the phone when they give you a call.  After we have the results, we will see you back.

## 2017-09-16 ENCOUNTER — Ambulatory Visit
Admission: RE | Admit: 2017-09-16 | Discharge: 2017-09-16 | Disposition: A | Discharge status: Home / Self Care | Payer: Medicare Other | Source: Ambulatory Visit | Attending: Internal Medicine | Admitting: Internal Medicine

## 2017-09-16 DIAGNOSIS — N6310 Unspecified lump in the right breast, unspecified quadrant: Secondary | ICD-10-CM

## 2017-09-16 DIAGNOSIS — R928 Other abnormal and inconclusive findings on diagnostic imaging of breast: Secondary | ICD-10-CM | POA: Diagnosis present

## 2017-09-16 HISTORY — PX: BREAST BIOPSY: SHX20

## 2017-09-17 ENCOUNTER — Other Ambulatory Visit: Payer: Self-pay | Admitting: Anatomic Pathology & Clinical Pathology

## 2017-09-22 ENCOUNTER — Ambulatory Visit (INDEPENDENT_AMBULATORY_CARE_PROVIDER_SITE_OTHER): Payer: Medicare Other | Admitting: Surgery

## 2017-09-22 ENCOUNTER — Encounter: Payer: Self-pay | Admitting: Surgery

## 2017-09-22 VITALS — BP 148/82 | HR 82 | Temp 97.9°F | Ht 66.0 in | Wt 216.0 lb

## 2017-09-22 DIAGNOSIS — C50919 Malignant neoplasm of unspecified site of unspecified female breast: Secondary | ICD-10-CM | POA: Diagnosis not present

## 2017-09-22 LAB — SURGICAL PATHOLOGY

## 2017-09-22 NOTE — Patient Instructions (Addendum)
Patient to be scheduled for right breat lumpectomy .  appt for Dr. Rogue Bussing 09/23/2017  The patient is scheduled for surgery at [facility] with Dr Dahlia Byes on 10/21/17. She will pre admit at the hospital. The patient is aware of date and instructions.

## 2017-09-23 ENCOUNTER — Inpatient Hospital Stay (HOSPITAL_BASED_OUTPATIENT_CLINIC_OR_DEPARTMENT_OTHER): Payer: Medicare Other | Admitting: Internal Medicine

## 2017-09-23 ENCOUNTER — Encounter: Payer: Self-pay | Admitting: Internal Medicine

## 2017-09-23 DIAGNOSIS — I10 Essential (primary) hypertension: Secondary | ICD-10-CM

## 2017-09-23 DIAGNOSIS — Z9012 Acquired absence of left breast and nipple: Secondary | ICD-10-CM

## 2017-09-23 DIAGNOSIS — C50211 Malignant neoplasm of upper-inner quadrant of right female breast: Secondary | ICD-10-CM | POA: Insufficient documentation

## 2017-09-23 DIAGNOSIS — Z170 Estrogen receptor positive status [ER+]: Principal | ICD-10-CM | POA: Insufficient documentation

## 2017-09-23 DIAGNOSIS — Z853 Personal history of malignant neoplasm of breast: Secondary | ICD-10-CM

## 2017-09-23 DIAGNOSIS — Z923 Personal history of irradiation: Secondary | ICD-10-CM

## 2017-09-23 NOTE — Progress Notes (Signed)
[facility] OFFICE PROGRESS NOTE  Patient Care Team: Valera Castle, MD as PCP - General (Family Medicine)  Cancer Staging No matching staging information was found for the patient.   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  female pleasant patient above history of right breast mass-finally underwent a biopsy is here for follow-up.  Patient after multiple missed appointments/finally followed up with radiology and biopsy of the right breast mass.  She is here to discuss treatment options.  Patient was also evaluated by surgery.  Patient denies any pain.  Review of Systems  Constitutional: Positive for malaise/fatigue. Negative for chills, diaphoresis, fever and weight loss.  HENT: Negative for nosebleeds and sore throat.   Eyes: Negative for double vision.  Respiratory: Negative for cough, hemoptysis, sputum production, shortness of breath and wheezing.   Cardiovascular: Negative for chest pain, palpitations, orthopnea and leg swelling.  Gastrointestinal: Negative for abdominal pain, blood in stool, constipation, diarrhea, heartburn, melena, nausea and vomiting.  Genitourinary: Negative for dysuria, frequency and urgency.  Musculoskeletal: Negative for back pain and joint pain.  Skin: Negative.  Negative for itching and rash.  Neurological: Negative for dizziness, tingling, focal weakness, weakness and headaches.   Endo/Heme/Allergies: Does not bruise/bleed easily.  Psychiatric/Behavioral: Negative for depression. The patient is not nervous/anxious and does not have insomnia.       PAST MEDICAL HISTORY :  Past Medical History:  Diagnosis Date  . Breast cancer ([place]) 03/27/10   lt mastectomy/radiation  . History of radiation therapy   . Hypertension   . Hypothyroidism   . Umbilical hernia     PAST SURGICAL HISTORY :   Past Surgical History:  Procedure Laterality Date  . BREAST BIOPSY Right 09/16/2017   path pending  . HERNIA REPAIR    . MASTECTOMY Left 06/19/2010   had radiation    FAMILY HISTORY :   Family History  Problem Relation Age of Onset  . Hypertension Father   . Hypertension Sister   . Diabetes Sister   . Breast cancer Neg Hx     SOCIAL HISTORY:   Social History   Tobacco Use  . Smoking status: Never Smoker  . Smokeless tobacco: Never Used  Substance Use Topics  . Alcohol use: No    Alcohol/week: 0.0 standard drinks  . Drug use: No    ALLERGIES:  is allergic to aspartame; nickel; penicillins; orange oil; sheep allergy; and yellow dye.  MEDICATIONS:  Current Outpatient Medications  Medication Sig Dispense Refill  . aspirin EC 81 MG tablet Take 1 tablet by mouth daily.    Marland Kitchen levothyroxine (SYNTHROID, LEVOTHROID) 50 MCG tablet Take 1 tablet by mouth daily.    Marland Kitchen lisinopril (PRINIVIL,ZESTRIL) 10 MG tablet Take 10 mg by mouth.    . torsemide (DEMADEX) 5 MG tablet Take 5 mg by mouth daily.     No current facility-administered medications for this visit.  PHYSICAL EXAMINATION: ECOG PERFORMANCE STATUS: 0 - Asymptomatic  BP (!) 195/77 (BP Location: Left Arm, Patient Position: Sitting)   Pulse 61   Temp 97.7 F (36.5 C) (Tympanic)   Resp 16   Wt 219 lb 2.2 oz (99.4 kg)   BMI 35.37 kg/m   Filed Weights   09/23/17 0912  Weight: 219 lb 2.2 oz (99.4 kg)    Physical Exam  Constitutional: She is oriented to person, place, and time and well-developed,  well-nourished, and in no distress.  HENT:  Head: Normocephalic and atraumatic.  Mouth/Throat: Oropharynx is clear and moist. No oropharyngeal exudate.  Eyes: Pupils are equal, round, and reactive to light.  Neck: Normal range of motion. Neck supple.  Cardiovascular: Normal rate and regular rhythm.  Pulmonary/Chest: No respiratory distress. She has no wheezes.  Abdominal: Soft. Bowel sounds are normal. She exhibits no distension and no mass. There is no tenderness. There is no rebound and no guarding.  Musculoskeletal: Normal range of motion. She exhibits no edema or tenderness.  Neurological: She is alert and oriented to person, place, and time.  Skin: Skin is warm.  Psychiatric:  Flat affect.       LABORATORY DATA:  I have reviewed the data as listed    Component Value Date/Time   NA 136 05/13/2017 1003   NA 142 04/18/2011 1101   K 4.0 05/13/2017 1003   K 4.2 04/18/2011 1101   CL 102 05/13/2017 1003   CL 102 04/18/2011 1101   CO2 27 05/13/2017 1003   CO2 33 (H) 04/18/2011 1101   GLUCOSE 106 (H) 05/13/2017 1003   GLUCOSE 118 (H) 04/18/2011 1101   BUN 18 05/13/2017 1003   BUN 21 (H) 04/18/2011 1101   CREATININE 0.66 05/13/2017 1003   CREATININE 0.61 05/05/2014 0853   CALCIUM 9.1 05/13/2017 1003   CALCIUM 9.0 04/18/2011 1101   PROT 7.7 05/13/2017 1003   PROT 7.3 05/05/2014 0853   ALBUMIN 4.0 05/13/2017 1003   ALBUMIN 4.2 05/05/2014 0853   AST 18 05/13/2017 1003   AST 13 (L) 05/05/2014 0853   ALT 13 (L) 05/13/2017 1003   ALT 10 (L) 05/05/2014 0853   ALKPHOS 55 05/13/2017 1003   ALKPHOS 50 05/05/2014 0853   BILITOT 0.5 05/13/2017 1003   BILITOT 0.7 05/05/2014 0853   GFRNONAA >60 05/13/2017 1003   GFRNONAA >60 05/05/2014 0853   GFRAA >60 05/13/2017 1003   GFRAA >60 05/05/2014 0853    No results found for: SPEP, UPEP  Lab Results  Component Value Date   WBC 4.8 05/13/2017   NEUTROABS 3.0 05/13/2017   HGB 12.4 05/13/2017   HCT 38.0 05/13/2017   MCV 86.9  05/13/2017   PLT 213 05/13/2017      Chemistry      Component Value Date/Time   NA 136 05/13/2017 1003   NA 142 04/18/2011 1101   K 4.0 05/13/2017 1003   K 4.2 04/18/2011 1101   CL 102 05/13/2017 1003   CL 102 04/18/2011 1101   CO2 27 05/13/2017 1003   CO2 33 (H) 04/18/2011 1101   BUN 18 05/13/2017 1003   BUN 21 (H) 04/18/2011 1101   CREATININE 0.66 05/13/2017 1003   CREATININE 0.61 05/05/2014 0853      Component Value Date/Time   CALCIUM 9.1 05/13/2017 1003   CALCIUM 9.0 04/18/2011 1101   ALKPHOS 55 05/13/2017 1003   ALKPHOS 50 05/05/2014 0853   AST 18 05/13/2017 1003   AST 13 (L) 05/05/2014 1660  ALT 13 (L) 05/13/2017 1003   ALT 10 (L) 05/05/2014 0853   BILITOT 0.5 05/13/2017 1003   BILITOT 0.7 05/05/2014 0853       RADIOGRAPHIC STUDIES: I have personally reviewed the radiological images as listed and agreed with the findings in the report. No results found.   ASSESSMENT & PLAN:  Carcinoma of upper-inner quadrant of right breast in female, estrogen receptor positive ([place]) #Right Breast lobular cancer-clinical stage I [cT1c cN0] ER PR positive HER-2 negative.  # I had a long discussion with the patient in general regarding the treatment options of breast cancer including-surgery; adjuvant radiation; role of adjuvant systemic therapy including-chemotherapy antihormone therapy.  #Discussed the surgical options of mastectomy versus lumpectomy.  Interested in lumpectomy.   #  Patient will benefit from antihormone therapy.  At this time I would avoid recommending systemic chemotherapy.  I discussed the potential benefits of each option; and also potential downsides in detail.  #Discussed that the goal of care would be cure-given the small primary noted on imaging.  However would await final pathology.  #Elevated blood APOLIDCV-013 systolic poorly controlled.   #Follow-up with me in 6 weeks./No labs.  Cc: Dr.Olemdo/[name]/[name]/Dr.Pabon.    No orders of the  defined types were placed in this encounter.  All questions were answered. The patient knows to call the clinic with any problems, questions or concerns.      Cammie Sickle, MD 09/23/2017 3:07 PM

## 2017-09-23 NOTE — Assessment & Plan Note (Addendum)
#  Right Breast lobular cancer-clinical stage I [cT1c cN0] ER PR positive HER-2 negative.  # I had a long discussion with the patient in general regarding the treatment options of breast cancer including-surgery; adjuvant radiation; role of adjuvant systemic therapy including-chemotherapy antihormone therapy.  #Discussed the surgical options of mastectomy versus lumpectomy.  Interested in lumpectomy.   #  Patient will benefit from antihormone therapy.  At this time I would avoid recommending systemic chemotherapy.  I discussed the potential benefits of each option; and also potential downsides in detail.  #Discussed that the goal of care would be cure-given the small primary noted on imaging.  However would await final pathology.  #Elevated blood [id number]-395 systolic poorly controlled.   #Follow-up with me in 6 weeks./No labs.  Cc: Dr.Olemdo/[name]/[name]/Dr.Pabon.

## 2017-09-24 ENCOUNTER — Other Ambulatory Visit: Payer: Self-pay

## 2017-09-24 DIAGNOSIS — C50919 Malignant neoplasm of unspecified site of unspecified female breast: Secondary | ICD-10-CM

## 2017-09-24 NOTE — Progress Notes (Signed)
Outpatient Surgical Follow Up  09/24/2017  Brenda Cain is an 84 y.o. female.   Chief Complaint  Patient presents with  . Follow-up    right breast biopsy 09/16/2017    HPI: Brenda Cain is a 84 y.o. female seen in after recent breast biopsy.  She did have a history of Malignant phyllodes tumor on the left breast requiring mastectomy by Dr. Pat Patrick 2012.   Since 2018 she is found to have a 2 cm solid mass at 12:00 concerning for cancer.  I have personally review the mammograms confirming this assessment.  Unfortunately has been a challenge to obtain good compliance of the patient.  She has canceled multiple appointments for biopsies.  She reports no pain no breast discharge. Recent biopsy shows invasive lobular carcinoma.   Past Medical History:  Diagnosis Date  . Breast cancer ([place]) 03/27/10   lt mastectomy/radiation  . History of radiation therapy   . Hypertension   . Hypothyroidism   . Umbilical hernia     Past Surgical History:  Procedure Laterality Date  . BREAST BIOPSY Right 09/16/2017   path pending  . HERNIA REPAIR    . MASTECTOMY Left 06/19/2010   had radiation    Family History  Problem Relation Age of Onset  . Hypertension Father   . Hypertension Sister   . Diabetes Sister   . Breast cancer Neg Hx     Social History:  reports that she has never smoked. She has never used smokeless tobacco. She reports that she does not drink alcohol or use drugs.  Allergies:  Allergies  Allergen Reactions  . Aspartame Hives, Itching and Swelling  . Nickel Hives and Swelling  . Penicillins Hives and Swelling  . Orange Oil Dermatitis    Patient states she can have oranges but not orange juice. Patient states she can have oranges but not orange juice. Patient states she can have oranges but not orange juice.   . Sheep Allergy Itching    Patient is allergic to wool. Patient is allergic to wool.   . Yellow Dye Rash    Medications reviewed.    ROS Full ROS  performed and is otherwise negative other than what is stated in HPI   BP (!) 148/82   Pulse 82   Temp 97.9 F (36.6 C) (Oral)   Ht 5\' 6"  (1.676 m)   Wt 216 lb (98 kg)   BMI 34.86 kg/m   Physical Exam  Constitutional: She is oriented to person, place, and time. She appears well-developed and well-nourished.  Eyes: Pupils are equal, round, and reactive to light. EOM are normal.  Neck: Normal range of motion. Neck supple. No JVD present. No tracheal deviation present. No thyromegaly present.  Cardiovascular: Normal rate and regular rhythm.  Pulmonary/Chest: Effort normal and breath sounds normal. No stridor. No respiratory distress. She has no wheezes. She has no rales.  BREAST: 3 cm mass w skin dimple on the Right breast. Mobile but hard. Located 12 oclock. 3 cm form nipple. No evidence of axillary LAD Left Chest wall previous mastectomy scar , no masses.  Abdominal: Soft. She exhibits no distension and no mass. There is no tenderness. There is no guarding. No hernia.  Lymphadenopathy:    She has no cervical adenopathy.  Neurological: She is alert and oriented to person, place, and time. She displays normal reflexes. No cranial nerve deficit. She exhibits normal muscle tone. Coordination normal.  Skin: Skin is warm and dry. Capillary refill takes  less than 2 seconds.  Psychiatric: She has a normal mood and affect. Her behavior is normal. Judgment and thought content normal.  Nursing note and vitals reviewed.     Assessment/Plan: 82-year-old female with newly diagnosed invasive lobular carcinoma on the right side.  Patient with a prior history of left mastectomy for malignant phyllodes and radiation therapy.  Discussed with the patient about options.  She is adamant that she does not want to go through a mastectomy again and that she rather " die" not having a mastectomy.  She understands that this might be a large mass and the pathology being lobular that may be multifocality.  I am  also concerned about the compliant of this patient however she is competent and has undergone a previous mastectomy so I can understand her fears and her frustrations.  I also discussed the case in detail with Dr. Delma Officer from oncology. Her extensive discussion with the patient we will proceed with a right lumpectomy with sentinel lymph node biopsy.  She understands that she will get hormonal therapy as well , I spent at least 40 minutes in this encounter with greater than 50% spent in coordination and counseling of her care.  Also wishes to wait until after the second week in September given that she is got some personal commitments that she wants to attend to.  Caroleen Hamman, MD [facility] General Surgeon

## 2017-09-24 NOTE — Addendum Note (Signed)
Addended by: Caroleen Hamman F on: 09/24/2017 04:55 PM   Modules accepted: Orders, SmartSet

## 2017-10-03 ENCOUNTER — Telehealth: Payer: Self-pay

## 2017-10-03 NOTE — Telephone Encounter (Signed)
Patient notified to report to the Radiology desk in the [place] at [facility] on 10/21/17 the morning of surgery at 9:15 am. She will pre admit at the hospital on 10/14/17 at 8:00 am. The patient is aware of dates, times, and instructions.

## 2017-10-14 ENCOUNTER — Encounter
Admission: RE | Admit: 2017-10-14 | Discharge: 2017-10-14 | Disposition: A | Discharge status: Home / Self Care | Payer: Medicare Other | Source: Ambulatory Visit | Attending: Surgery | Admitting: Surgery

## 2017-10-14 ENCOUNTER — Other Ambulatory Visit: Payer: Self-pay

## 2017-10-14 DIAGNOSIS — R9431 Abnormal electrocardiogram [ECG] [EKG]: Secondary | ICD-10-CM | POA: Insufficient documentation

## 2017-10-14 DIAGNOSIS — I498 Other specified cardiac arrhythmias: Secondary | ICD-10-CM | POA: Diagnosis not present

## 2017-10-14 DIAGNOSIS — Z01818 Encounter for other preprocedural examination: Secondary | ICD-10-CM | POA: Diagnosis not present

## 2017-10-14 DIAGNOSIS — I10 Essential (primary) hypertension: Secondary | ICD-10-CM | POA: Diagnosis not present

## 2017-10-14 HISTORY — DX: Prediabetes: R73.03

## 2017-10-14 LAB — BASIC METABOLIC PANEL
ANION GAP: 7 (ref 5–15)
BUN: 18 mg/dL (ref 8–23)
CO2: 31 mmol/L (ref 22–32)
Calcium: 9.2 mg/dL (ref 8.9–10.3)
Chloride: 104 mmol/L (ref 98–111)
Creatinine, Ser: 0.75 mg/dL (ref 0.44–1.00)
GFR calc Af Amer: >60 mL/min (ref >60)
GFR calc non Af Amer: >60 mL/min (ref >60)
GLUCOSE: 102 mg/dL — AB (ref 70–99)
POTASSIUM: 4.1 mmol/L (ref 3.5–5.1)
Sodium: 142 mmol/L (ref 135–145)

## 2017-10-14 LAB — CBC
HEMATOCRIT: 37.3 % (ref 35.0–47.0)
Hemoglobin: 12.5 g/dL (ref 12.0–16.0)
MCH: 29.5 pg (ref 26.0–34.0)
MCHC: 33.4 g/dL (ref 32.0–36.0)
MCV: 88.5 fL (ref 80.0–100.0)
Platelets: 205 10*3/uL (ref 150–440)
RBC: 4.22 MIL/uL (ref 3.80–5.20)
RDW: 13.8 % (ref 11.5–14.5)
WBC: 4.5 10*3/uL (ref 3.6–11.0)

## 2017-10-14 NOTE — Pre-Procedure Instructions (Addendum)
CALL FROM [facility], PATIENT REFUSING TO SEE DR OLMEDO FOR CLEARANCE. DR OLMEDO WILL NOT CLEAR WITHOUT SEEING PATIENT. UNABLE TO REACH PATIENT AT LISTED NUMBERS. NOTIFIED CAROLYN AT DR PABON'S

## 2017-10-14 NOTE — Patient Instructions (Signed)
  Your procedure is scheduled on: Tuesday October 21, 2017 Report to Same Day Surgery 2nd floor medical mall ([facility] Entrance-take elevator on left to 2nd floor.  Check in with surgery information desk.) To find out your arrival time please call [phone] between 1PM - 3PM on Monday October 20, 2017  Remember: Instructions that are not followed completely may result in serious medical risk, up to and including death, or upon the discretion of your surgeon and anesthesiologist your surgery may need to be rescheduled.    _x___ 1. Do not eat food (including mints, candies, chewing gum) after midnight the night before your procedure. You may drink clear liquids up to 2 hours before you are scheduled to arrive at the hospital for your procedure.  Do not drink clear liquids within 2 hours of your scheduled arrival to the hospital.  Clear liquids include  --Water or Apple juice without pulp  --Clear carbohydrate beverage such as Gatorade  --Black Coffee or Clear Tea (No milk, no creamers, do not add anything to the coffee or tea)    __x__ 2. No Alcohol for 24 hours before or after surgery.   __x__ 3. No Smoking or e-cigarettes for 24 prior to surgery.  Do not use any chewable tobacco products for at least 6 hour prior to surgery   __x__ 4. Notify your doctor if there is any change in your medical condition (cold, fever, infections).   __x__ 5. On the morning of surgery brush your teeth with toothpaste and water.  You may rinse your mouth with mouth wash if you wish.  Do not swallow any toothpaste or mouthwash.  Please read over the following fact sheets that you were given:   [facility] Preparing for Surgery and or MRSA Information    __x__ Use CHG Soap or sage wipes as directed on instruction sheet    Do not wear jewelry, make-up, hairpins, clips or nail polish.  Do not wear lotions, powders, deodorant, or perfumes.   Do not shave below the face/neck 48 hours prior to surgery.    Do not bring valuables to the hospital.    [facility] is not responsible for any belongings or valuables.               Contacts, dentures or bridgework may not be worn into surgery.  Leave your suitcase in the car. After surgery it may be brought to your room.  For patients admitted to the hospital, discharge time is determined by your treatment team.  For patients discharged on the day of surgery, you will NOT be permitted to drive yourself home.   _x___ Take anti-hypertensive listed below, cardiac, seizure, asthma, anti-reflux and psychiatric medicines. These include:  1. Levothyroxine/Synthroid  Do not take your Lisinopril/Prinivil or Torsemide/Demadex on the day of surgery.  _x___ NOW: Follow recommendations from Cardiologist, Pulmonologist or PCP regarding stopping Aspirin, Coumadin, Plavix ,Eliquis, Effient, or Pradaxa, and Pletal.  _x___ Stop Anti-inflammatories such as Advil, Aleve, Ibuprofen, Motrin, Naproxen, Naprosyn, Goodies powders or aspirin products. OK to take Tylenol and Celebrex.   _x___ NOW: Stop supplements (Calcium) until after surgery.  But may continue Vitamin D, Vitamin B, and multivitamin.

## 2017-10-14 NOTE — Pre-Procedure Instructions (Addendum)
AS INSTRUCTED BY DR P CARROLL, EKG FAXED TO DR OLMEDO TO CLEAR FOR SURGERY. ALSO FAXED FYI TO DR PABON. SPOKE WITH JULIE AT DR Devona Konig

## 2017-10-16 NOTE — Pre-Procedure Instructions (Addendum)
SPOKE WITH MICHELLE AT DR PABON'S. SHE STATES DR PABON TALKING TO ANESTHESIA  RE CLEARANCE CAROLYN AT DR PABON'S CALLED. DR PABON HAS DISCUSSED WITH DR Rosey Bath AND CAN PROCEED. NO CLEARANCE FROM PCP NEEDED

## 2017-10-17 ENCOUNTER — Telehealth: Payer: Self-pay

## 2017-10-17 NOTE — Telephone Encounter (Signed)
Received a call from Dr Dahlia Byes and he says that Anesthesia will not allow the patient to have surgery unless she has medical clearance from her primary care physician, Dr Jefm Bryant. Surgery has been canceled for now. OR and Nuclear medicine have been notified of the cancellations.  We have attempted to contact the patient at both numbers and cannot leave a message. Her sister has been contacted and she will have the patient call us.

## 2017-10-20 ENCOUNTER — Telehealth: Payer: Self-pay | Admitting: *Deleted

## 2017-10-20 NOTE — Telephone Encounter (Signed)
Patient was contacted this morning and made aware of the need for medical clearance prior to surgery.   She is aware that surgery that was scheduled for tomorrow has been cancelled pending clearance.   An appointment has been scheduled for the patient to see Dr. Kym Groom on 10-21-17 at 11:20 am. She verbalizes understanding.   Patient also made aware that Dr. Devona Konig office will need to fax [place] clearance once he has cleared her for surgery and we can get surgery rescheduled once it is received.

## 2017-10-21 ENCOUNTER — Ambulatory Visit: Admission: RE | Admit: 2017-10-21 | Payer: Medicare Other | Source: Ambulatory Visit | Admitting: Surgery

## 2017-10-21 ENCOUNTER — Ambulatory Visit: Payer: Medicare Other

## 2017-10-21 ENCOUNTER — Encounter: Admission: RE | Payer: Self-pay | Source: Ambulatory Visit

## 2017-10-21 SURGERY — BREAST LUMPECTOMY
Anesthesia: Choice | Laterality: Right

## 2017-10-27 ENCOUNTER — Telehealth: Payer: Self-pay

## 2017-10-27 NOTE — Addendum Note (Signed)
Addended by: Caroleen Hamman F on: 10/27/2017 01:50 PM   Modules accepted: Orders, SmartSet

## 2017-10-27 NOTE — Telephone Encounter (Signed)
Call to patient to reschedule surgery. She is rescheduled for surgery with Dr Dahlia Byes at [facility] on 11/05/17. She will not need to pre admit as this has already been done on 10/14/17. She will report to the Radiology disk on 11/05/17 at 9:15 am. Surgery instructions have been mailed to the patient.

## 2017-11-04 ENCOUNTER — Inpatient Hospital Stay: Payer: Medicare Other | Attending: Internal Medicine | Admitting: Internal Medicine

## 2017-11-04 DIAGNOSIS — I10 Essential (primary) hypertension: Secondary | ICD-10-CM | POA: Insufficient documentation

## 2017-11-04 DIAGNOSIS — C50211 Malignant neoplasm of upper-inner quadrant of right female breast: Secondary | ICD-10-CM | POA: Insufficient documentation

## 2017-11-04 DIAGNOSIS — Z170 Estrogen receptor positive status [ER+]: Secondary | ICD-10-CM | POA: Insufficient documentation

## 2017-11-04 MED ORDER — CLINDAMYCIN PHOSPHATE 900 MG/50ML IV SOLN
900.0000 mg | INTRAVENOUS | Status: AC
  Administered 2017-11-05: 900 mg via INTRAVENOUS

## 2017-11-04 NOTE — Assessment & Plan Note (Deleted)
#  Right Breast lobular cancer-clinical stage I [cT1c cN0] ER PR positive HER-2 negative.  # I had a long discussion with the patient in general regarding the treatment options of breast cancer including-surgery; adjuvant radiation; role of adjuvant systemic therapy including-chemotherapy antihormone therapy.  #Discussed the surgical options of mastectomy versus lumpectomy.  Interested in lumpectomy.   #  Patient will benefit from antihormone therapy.  At this time I would avoid recommending systemic chemotherapy.  I discussed the potential benefits of each option; and also potential downsides in detail.  #Discussed that the goal of care would be cure-given the small primary noted on imaging.  However would await final pathology.  #Elevated blood pressure-190 systolic poorly controlled.   #Follow-up with me in 6 weeks./No labs.  Cc: Dr.Olemdo/[name]/[name]/Dr.Pabon.  

## 2017-11-04 NOTE — Progress Notes (Deleted)
[place] OFFICE PROGRESS NOTE  Patient Care Team: Valera Castle, MD as PCP - General (Family Medicine)  Cancer Staging No matching staging information was found for the patient.   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  female pleasant patient above history of right breast mass-finally underwent a biopsy is here for follow-up.  Patient after multiple missed appointments/finally followed up with radiology and biopsy of the right breast mass.  She is here to discuss treatment options.  Patient was also evaluated by surgery.  Patient denies any pain.  Review of Systems  Constitutional: Positive for malaise/fatigue. Negative for chills, diaphoresis, fever and weight loss.  HENT: Negative for nosebleeds and sore throat.   Eyes: Negative for double vision.  Respiratory: Negative for cough, hemoptysis, sputum production, shortness of breath and wheezing.   Cardiovascular: Negative for chest pain, palpitations, orthopnea and leg swelling.  Gastrointestinal: Negative for abdominal pain, blood in stool, constipation, diarrhea, heartburn, melena, nausea and vomiting.  Genitourinary: Negative for dysuria, frequency and urgency.  Musculoskeletal: Negative for back pain and joint pain.  Skin: Negative.  Negative for itching and rash.  Neurological: Negative for dizziness, tingling, focal weakness, weakness and headaches.   Endo/Heme/Allergies: Does not bruise/bleed easily.  Psychiatric/Behavioral: Negative for depression. The patient is not nervous/anxious and does not have insomnia.       PAST MEDICAL HISTORY :  Past Medical History:  Diagnosis Date  . Breast cancer ([place]) 03/27/10   lt mastectomy/radiation  . History of radiation therapy   . Hypertension   . Hypothyroidism   . Pre-diabetes   . Umbilical hernia     PAST SURGICAL HISTORY :   Past Surgical History:  Procedure Laterality Date  . BREAST BIOPSY Right 09/16/2017   path pending  . HERNIA REPAIR    . MASTECTOMY Left 06/19/2010   had radiation    FAMILY HISTORY :   Family History  Problem Relation Age of Onset  . Hypertension Father   . Hypertension Sister   . Diabetes Sister   . Breast cancer Neg Hx     SOCIAL HISTORY:   Social History   Tobacco Use  . Smoking status: Never Smoker  . Smokeless tobacco: Never Used  Substance Use Topics  . Alcohol use: No    Alcohol/week: 0.0 standard drinks  . Drug use: No    ALLERGIES:  is allergic to aspartame; nickel; penicillins; orange oil; red dye; sheep allergy; and yellow dye.  MEDICATIONS:  Current Outpatient Medications  Medication Sig Dispense Refill  . aspirin EC 81 MG tablet Take 1 tablet by mouth daily.    Marland Kitchen CALCIUM-MAGNESIUM-ZINC PO Take by mouth daily.    Marland Kitchen levothyroxine (SYNTHROID, LEVOTHROID) 50 MCG tablet Take 1 tablet by mouth daily.    Marland Kitchen lisinopril (PRINIVIL,ZESTRIL) 10 MG tablet Take 10 mg by mouth.    . torsemide (DEMADEX) 5 MG tablet Take 5 mg  by mouth daily.    . Vitamin D, Cholecalciferol, 400 units TABS Take by mouth daily.     No current facility-administered medications for this visit.     PHYSICAL EXAMINATION: ECOG PERFORMANCE STATUS: 0 - Asymptomatic  There were no vitals taken for this visit.  There were no vitals filed for this visit.  Physical Exam  Constitutional: She is oriented to person, place, and time and well-developed,  well-nourished, and in no distress.  HENT:  Head: Normocephalic and atraumatic.  Mouth/Throat: Oropharynx is clear and moist. No oropharyngeal exudate.  Eyes: Pupils are equal, round, and reactive to light.  Neck: Normal range of motion. Neck supple.  Cardiovascular: Normal rate and regular rhythm.  Pulmonary/Chest: No respiratory distress. She has no wheezes.  Abdominal: Soft. Bowel sounds are normal. She exhibits no distension and no mass. There is no tenderness. There is no rebound and no guarding.  Musculoskeletal: Normal range of motion. She exhibits no edema or tenderness.  Neurological: She is alert and oriented to person, place, and time.  Skin: Skin is warm.  Psychiatric:  Flat affect.       LABORATORY DATA:  I have reviewed the data as listed    Component Value Date/Time   NA 142 10/14/2017 0906   NA 142 04/18/2011 1101   K 4.1 10/14/2017 0906   K 4.2 04/18/2011 1101   CL 104 10/14/2017 0906   CL 102 04/18/2011 1101   CO2 31 10/14/2017 0906   CO2 33 (H) 04/18/2011 1101   GLUCOSE 102 (H) 10/14/2017 0906   GLUCOSE 118 (H) 04/18/2011 1101   BUN 18 10/14/2017 0906   BUN 21 (H) 04/18/2011 1101   CREATININE 0.75 10/14/2017 0906   CREATININE 0.61 05/05/2014 0853   CALCIUM 9.2 10/14/2017 0906   CALCIUM 9.0 04/18/2011 1101   PROT 7.7 05/13/2017 1003   PROT 7.3 05/05/2014 0853   ALBUMIN 4.0 05/13/2017 1003   ALBUMIN 4.2 05/05/2014 0853   AST 18 05/13/2017 1003   AST 13 (L) 05/05/2014 0853   ALT 13 (L) 05/13/2017 1003   ALT 10 (L) 05/05/2014 0853   ALKPHOS 55 05/13/2017 1003   ALKPHOS 50 05/05/2014 0853   BILITOT 0.5 05/13/2017 1003   BILITOT 0.7 05/05/2014 0853   GFRNONAA >60 10/14/2017 0906   GFRNONAA >60 05/05/2014 0853   GFRAA >60 10/14/2017 0906   GFRAA >60 05/05/2014 0853    No results found for: SPEP, UPEP  Lab Results  Component Value Date   WBC 4.5 10/14/2017   NEUTROABS 3.0 05/13/2017   HGB 12.5 10/14/2017   HCT 37.3 10/14/2017   MCV 88.5  10/14/2017   PLT 205 10/14/2017      Chemistry      Component Value Date/Time   NA 142 10/14/2017 0906   NA 142 04/18/2011 1101   K 4.1 10/14/2017 0906   K 4.2 04/18/2011 1101   CL 104 10/14/2017 0906   CL 102 04/18/2011 1101   CO2 31 10/14/2017 0906   CO2 33 (H) 04/18/2011 1101   BUN 18 10/14/2017 0906   BUN 21 (H) 04/18/2011 1101   CREATININE 0.75 10/14/2017 0906   CREATININE 0.61 05/05/2014 0853      Component Value Date/Time   CALCIUM 9.2 10/14/2017 0906   CALCIUM 9.0 04/18/2011 1101   ALKPHOS 55 05/13/2017 1003   ALKPHOS 50 05/05/2014 0853   AST 18 05/13/2017 1003   AST 13 (L) 05/05/2014 0853   ALT 13 (L) 05/13/2017 1003   ALT  10 (L) 05/05/2014 0853   BILITOT 0.5 05/13/2017 1003   BILITOT 0.7 05/05/2014 0853       RADIOGRAPHIC STUDIES: I have personally reviewed the radiological images as listed and agreed with the findings in the report. No results found.   ASSESSMENT & PLAN:  No problem-specific Assessment & Plan notes found for this encounter.   No orders of the defined types were placed in this encounter.  All questions were answered. The patient knows to call the clinic with any problems, questions or concerns.      Cammie Sickle, MD 11/04/2017 7:00 AM

## 2017-11-05 ENCOUNTER — Ambulatory Visit: Payer: Medicare Other | Admitting: Certified Registered Nurse Anesthetist

## 2017-11-05 ENCOUNTER — Encounter: Payer: Self-pay | Admitting: Emergency Medicine

## 2017-11-05 ENCOUNTER — Encounter
Admission: RE | Disposition: A | Discharge status: Home / Self Care | Payer: Self-pay | Source: Ambulatory Visit | Attending: Surgery

## 2017-11-05 ENCOUNTER — Ambulatory Visit
Admission: RE | Admit: 2017-11-05 | Discharge: 2017-11-05 | Disposition: A | Discharge status: Home / Self Care | Payer: Medicare Other | Source: Ambulatory Visit | Attending: Surgery | Admitting: Surgery

## 2017-11-05 ENCOUNTER — Other Ambulatory Visit: Payer: Self-pay

## 2017-11-05 ENCOUNTER — Encounter
Admission: RE | Admit: 2017-11-05 | Discharge: 2017-11-05 | Disposition: A | Discharge status: Home / Self Care | Payer: Medicare Other | Source: Ambulatory Visit | Attending: Surgery | Admitting: Surgery

## 2017-11-05 DIAGNOSIS — Z888 Allergy status to other drugs, medicaments and biological substances status: Secondary | ICD-10-CM | POA: Diagnosis not present

## 2017-11-05 DIAGNOSIS — Z9012 Acquired absence of left breast and nipple: Secondary | ICD-10-CM | POA: Diagnosis not present

## 2017-11-05 DIAGNOSIS — R7303 Prediabetes: Secondary | ICD-10-CM | POA: Insufficient documentation

## 2017-11-05 DIAGNOSIS — Z923 Personal history of irradiation: Secondary | ICD-10-CM | POA: Diagnosis not present

## 2017-11-05 DIAGNOSIS — E039 Hypothyroidism, unspecified: Secondary | ICD-10-CM | POA: Insufficient documentation

## 2017-11-05 DIAGNOSIS — C50911 Malignant neoplasm of unspecified site of right female breast: Secondary | ICD-10-CM | POA: Insufficient documentation

## 2017-11-05 DIAGNOSIS — Z9109 Other allergy status, other than to drugs and biological substances: Secondary | ICD-10-CM | POA: Insufficient documentation

## 2017-11-05 DIAGNOSIS — Z8249 Family history of ischemic heart disease and other diseases of the circulatory system: Secondary | ICD-10-CM | POA: Diagnosis not present

## 2017-11-05 DIAGNOSIS — C50211 Malignant neoplasm of upper-inner quadrant of right female breast: Secondary | ICD-10-CM | POA: Diagnosis not present

## 2017-11-05 DIAGNOSIS — C773 Secondary and unspecified malignant neoplasm of axilla and upper limb lymph nodes: Secondary | ICD-10-CM | POA: Diagnosis not present

## 2017-11-05 DIAGNOSIS — Z853 Personal history of malignant neoplasm of breast: Secondary | ICD-10-CM | POA: Diagnosis not present

## 2017-11-05 DIAGNOSIS — I10 Essential (primary) hypertension: Secondary | ICD-10-CM | POA: Diagnosis not present

## 2017-11-05 DIAGNOSIS — Z170 Estrogen receptor positive status [ER+]: Secondary | ICD-10-CM | POA: Diagnosis not present

## 2017-11-05 DIAGNOSIS — Z833 Family history of diabetes mellitus: Secondary | ICD-10-CM | POA: Diagnosis not present

## 2017-11-05 DIAGNOSIS — Z880 Allergy status to penicillin: Secondary | ICD-10-CM | POA: Diagnosis not present

## 2017-11-05 DIAGNOSIS — C50919 Malignant neoplasm of unspecified site of unspecified female breast: Secondary | ICD-10-CM

## 2017-11-05 HISTORY — PX: BREAST LUMPECTOMY WITH SENTINEL LYMPH NODE BIOPSY: SHX5597

## 2017-11-05 HISTORY — PX: BREAST EXCISIONAL BIOPSY: SUR124

## 2017-11-05 SURGERY — BREAST LUMPECTOMY WITH SENTINEL LYMPH NODE BX
Anesthesia: General | Site: Breast | Laterality: Right

## 2017-11-05 MED ORDER — LIDOCAINE HCL (CARDIAC) PF 100 MG/5ML IV SOSY
PREFILLED_SYRINGE | INTRAVENOUS | Status: DC | PRN
  Administered 2017-11-05: 100 mg via INTRAVENOUS

## 2017-11-05 MED ORDER — SEVOFLURANE IN SOLN
RESPIRATORY_TRACT | Status: AC
  Filled 2017-11-05: qty 250

## 2017-11-05 MED ORDER — PROPOFOL 10 MG/ML IV BOLUS
INTRAVENOUS | Status: AC
  Filled 2017-11-05: qty 20

## 2017-11-05 MED ORDER — EPHEDRINE SULFATE 50 MG/ML IJ SOLN
INTRAMUSCULAR | Status: DC | PRN
  Administered 2017-11-05: 10 mg via INTRAVENOUS
  Administered 2017-11-05: 5 mg via INTRAVENOUS

## 2017-11-05 MED ORDER — SUGAMMADEX SODIUM 500 MG/5ML IV SOLN
INTRAVENOUS | Status: DC | PRN
  Administered 2017-11-05: 200 mg via INTRAVENOUS

## 2017-11-05 MED ORDER — FENTANYL CITRATE (PF) 100 MCG/2ML IJ SOLN
INTRAMUSCULAR | Status: DC | PRN
  Administered 2017-11-05 (×2): 50 ug via INTRAVENOUS

## 2017-11-05 MED ORDER — SUCCINYLCHOLINE CHLORIDE 20 MG/ML IJ SOLN
INTRAMUSCULAR | Status: AC
  Filled 2017-11-05: qty 1

## 2017-11-05 MED ORDER — BUPIVACAINE-EPINEPHRINE (PF) 0.25% -1:200000 IJ SOLN
INTRAMUSCULAR | Status: DC | PRN
  Administered 2017-11-05: 30 mL via PERINEURAL

## 2017-11-05 MED ORDER — FENTANYL CITRATE (PF) 100 MCG/2ML IJ SOLN
INTRAMUSCULAR | Status: AC
  Filled 2017-11-05: qty 2

## 2017-11-05 MED ORDER — ISOSULFAN BLUE 1 % ~~LOC~~ SOLN
SUBCUTANEOUS | Status: AC
  Filled 2017-11-05: qty 5

## 2017-11-05 MED ORDER — BUPIVACAINE-EPINEPHRINE (PF) 0.25% -1:200000 IJ SOLN
INTRAMUSCULAR | Status: AC
  Filled 2017-11-05: qty 30

## 2017-11-05 MED ORDER — MIDAZOLAM HCL 2 MG/2ML IJ SOLN
INTRAMUSCULAR | Status: AC
  Filled 2017-11-05: qty 2

## 2017-11-05 MED ORDER — ACETAMINOPHEN NICU IV SYRINGE 10 MG/ML
INTRAVENOUS | Status: AC
  Filled 2017-11-05: qty 1

## 2017-11-05 MED ORDER — ONDANSETRON HCL 4 MG/2ML IJ SOLN
INTRAMUSCULAR | Status: AC
  Filled 2017-11-05: qty 2

## 2017-11-05 MED ORDER — DEXAMETHASONE SODIUM PHOSPHATE 10 MG/ML IJ SOLN
INTRAMUSCULAR | Status: AC
  Filled 2017-11-05: qty 1

## 2017-11-05 MED ORDER — ROCURONIUM BROMIDE 50 MG/5ML IV SOLN
INTRAVENOUS | Status: AC
  Filled 2017-11-05: qty 1

## 2017-11-05 MED ORDER — BUPIVACAINE HCL (PF) 0.25 % IJ SOLN
INTRAMUSCULAR | Status: AC
  Filled 2017-11-05: qty 30

## 2017-11-05 MED ORDER — SUCCINYLCHOLINE CHLORIDE 20 MG/ML IJ SOLN
INTRAMUSCULAR | Status: DC | PRN
  Administered 2017-11-05: 100 mg via INTRAVENOUS

## 2017-11-05 MED ORDER — CLINDAMYCIN PHOSPHATE 900 MG/50ML IV SOLN
INTRAVENOUS | Status: AC
  Filled 2017-11-05: qty 50

## 2017-11-05 MED ORDER — ONDANSETRON HCL 4 MG/2ML IJ SOLN: 4.0000 mg | Freq: Once | INTRAMUSCULAR | Status: DC | PRN

## 2017-11-05 MED ORDER — ONDANSETRON HCL 4 MG/2ML IJ SOLN
INTRAMUSCULAR | Status: DC | PRN
  Administered 2017-11-05: 4 mg via INTRAVENOUS

## 2017-11-05 MED ORDER — HYDROCODONE-ACETAMINOPHEN 5-325 MG PO TABS: 1.0000 | ORAL_TABLET | Freq: Four times a day (QID) | ORAL | 0 refills | Status: DC | PRN

## 2017-11-05 MED ORDER — ACETAMINOPHEN 10 MG/ML IV SOLN
INTRAVENOUS | Status: DC | PRN
  Administered 2017-11-05: 1000 mg via INTRAVENOUS

## 2017-11-05 MED ORDER — ROCURONIUM BROMIDE 100 MG/10ML IV SOLN
INTRAVENOUS | Status: DC | PRN
  Administered 2017-11-05 (×2): 5 mg via INTRAVENOUS
  Administered 2017-11-05: 20 mg via INTRAVENOUS

## 2017-11-05 MED ORDER — PROPOFOL 10 MG/ML IV BOLUS
INTRAVENOUS | Status: DC | PRN
  Administered 2017-11-05: 150 mg via INTRAVENOUS

## 2017-11-05 MED ORDER — FENTANYL CITRATE (PF) 100 MCG/2ML IJ SOLN
25.0000 ug | INTRAMUSCULAR | Status: DC | PRN
  Administered 2017-11-05 (×4): 25 ug via INTRAVENOUS

## 2017-11-05 MED ORDER — TECHNETIUM TC 99M SULFUR COLLOID FILTERED
1.0000 | Freq: Once | INTRAVENOUS | Status: AC | PRN
  Administered 2017-11-05: 0.889 via INTRADERMAL

## 2017-11-05 MED ORDER — DEXAMETHASONE SODIUM PHOSPHATE 10 MG/ML IJ SOLN
INTRAMUSCULAR | Status: DC | PRN
  Administered 2017-11-05: 5 mg via INTRAVENOUS

## 2017-11-05 MED ORDER — SODIUM CHLORIDE 0.9 % IV SOLN
INTRAVENOUS | Status: DC
  Administered 2017-11-05: 10:00:00 via INTRAVENOUS

## 2017-11-05 MED ORDER — CHLORHEXIDINE GLUCONATE CLOTH 2 % EX PADS: 6.0000 | MEDICATED_PAD | Freq: Once | CUTANEOUS | Status: DC

## 2017-11-05 MED ORDER — LIDOCAINE HCL (PF) 2 % IJ SOLN
INTRAMUSCULAR | Status: AC
  Filled 2017-11-05: qty 10

## 2017-11-05 MED ORDER — ISOSULFAN BLUE 1 % ~~LOC~~ SOLN
SUBCUTANEOUS | Status: DC | PRN
  Administered 2017-11-05: 5 mL via SUBCUTANEOUS

## 2017-11-05 SURGICAL SUPPLY — 40 items
APPLIER CLIP 9.375 SM OPEN (CLIP) ×4
BLADE SURG 15 STRL LF DISP TIS (BLADE) ×1 IMPLANT
BLADE SURG 15 STRL SS (BLADE) ×1
CANISTER SUCT 1200ML W/VALVE (MISCELLANEOUS) ×2 IMPLANT
CHLORAPREP W/TINT 26ML (MISCELLANEOUS) IMPLANT
CLIP APPLIE 9.375 SM OPEN (CLIP) ×2 IMPLANT
CNTNR SPEC 2.5X3XGRAD LEK (MISCELLANEOUS) ×2
CONT SPEC 4OZ STER OR WHT (MISCELLANEOUS) ×2
CONTAINER SPEC 2.5X3XGRAD LEK (MISCELLANEOUS) ×2 IMPLANT
COVER PROBE FLX POLY STRL (MISCELLANEOUS) ×2 IMPLANT
COVER WAND RF STERILE (DRAPES) ×2 IMPLANT
DERMABOND ADVANCED (GAUZE/BANDAGES/DRESSINGS) ×2
DERMABOND ADVANCED .7 DNX12 (GAUZE/BANDAGES/DRESSINGS) ×2 IMPLANT
DRAPE INCISE IOBAN 66X45 STRL (DRAPES) ×2 IMPLANT
DRAPE LAPAROTOMY TRNSV 106X77 (MISCELLANEOUS) ×2 IMPLANT
DRAPE SHEET LG 3/4 BI-LAMINATE (DRAPES) ×2 IMPLANT
ELECT REM PT RETURN 9FT ADLT (ELECTROSURGICAL) ×2
ELECTRODE REM PT RTRN 9FT ADLT (ELECTROSURGICAL) ×1 IMPLANT
GLOVE BIO SURGEON STRL SZ7 (GLOVE) ×2 IMPLANT
GLOVE INDICATOR 7.5 STRL GRN (GLOVE) ×2 IMPLANT
GOWN STRL REUS W/ TWL LRG LVL3 (GOWN DISPOSABLE) ×3 IMPLANT
GOWN STRL REUS W/TWL LRG LVL3 (GOWN DISPOSABLE) ×3
KIT TURNOVER KIT A (KITS) ×2 IMPLANT
LABEL OR SOLS (LABEL) ×2 IMPLANT
MARGIN MAP 10MM (MISCELLANEOUS) ×2 IMPLANT
NEEDLE HYPO 22GX1.5 SAFETY (NEEDLE) ×2 IMPLANT
PACK BASIN MINOR ARMC (MISCELLANEOUS) ×2 IMPLANT
SLEVE PROBE SENORX GAMMA FIND (MISCELLANEOUS) ×2 IMPLANT
SPONGE LAP 18X18 RF (DISPOSABLE) ×2 IMPLANT
SUT ETHILON 3-0 FS-10 30 BLK (SUTURE) ×2
SUT MNCRL 4-0 (SUTURE) ×2
SUT MNCRL 4-0 27XMFL (SUTURE) ×2
SUT SILK 2 0 SH (SUTURE) ×2 IMPLANT
SUT VIC AB 2-0 CT1 (SUTURE) ×2 IMPLANT
SUT VIC AB 3-0 SH 27 (SUTURE) ×2
SUT VIC AB 3-0 SH 27X BRD (SUTURE) ×2 IMPLANT
SUTURE EHLN 3-0 FS-10 30 BLK (SUTURE) ×1 IMPLANT
SUTURE MNCRL 4-0 27XMF (SUTURE) ×2 IMPLANT
SYR 20CC LL (SYRINGE) ×2 IMPLANT
WATER STERILE IRR 1000ML POUR (IV SOLUTION) ×2 IMPLANT

## 2017-11-05 NOTE — H&P (Signed)
HPI: Brenda Cain a 84 y.o.femaleseen in after recent breast biopsy.She did have a history of Malignantphyllodes tumor on the left breast requiring mastectomy by Dr. [id number]. Since 2018 she is found to have a 2 cm solid mass at 12:00 concerning for cancer. I have personally review the mammograms confirming this assessment. Unfortunately has been a challenge to obtain good compliance of the patient. She has canceled multiple appointments for biopsies. She reports no pain no breast discharge. Recent biopsy shows invasive lobular carcinoma.       Past Medical History:  Diagnosis Date  . Breast cancer ([place]) 03/27/10   lt mastectomy/radiation  . History of radiation therapy   . Hypertension   . Hypothyroidism   . Umbilical hernia          Past Surgical History:  Procedure Laterality Date  . BREAST BIOPSY Right 09/16/2017   path pending  . HERNIA REPAIR    . MASTECTOMY Left 06/19/2010   had radiation         Family History  Problem Relation Age of Onset  . Hypertension Father   . Hypertension Sister   . Diabetes Sister   . Breast cancer Neg Hx     Social History:  reports that she has never smoked. She has never used smokeless tobacco. She reports that she does not drink alcohol or use drugs.  Allergies:       Allergies  Allergen Reactions  . Aspartame Hives, Itching and Swelling  . Nickel Hives and Swelling  . Penicillins Hives and Swelling  . Orange Oil Dermatitis    Patient states she can have oranges but not orange juice. Patient states she can have oranges but not orange juice. Patient states she can have oranges but not orange juice.   . Sheep Allergy Itching    Patient is allergic to wool. Patient is allergic to wool.   . Yellow Dye Rash    Medications reviewed.    ROS Full ROS performed and is otherwise negative other than what is stated in HPI  Physical Exam  Constitutional: She is oriented to  person, place, and time. She appears well-developed and well-nourished.  Eyes: Pupils are equal, round, and reactive to light. EOM are normal.  Neck: Normal range of motion. Neck supple. No JVD present. No tracheal deviation present. No thyromegaly present.  Cardiovascular: Normal rate and regular rhythm.  Pulmonary/Chest: Effort normal and breath sounds normal. No stridor. No respiratory distress. She has no wheezes. She has no rales.  BREAST: 3 cm mass w skin dimple on the Right breast. Mobile but hard. Located 12 oclock. 3 cm form nipple. No evidence of axillary LAD Left Chest wall previous mastectomy scar , no masses.  Abdominal: Soft. She exhibits no distension and no mass. There is no tenderness. There is no guarding. No hernia.  Lymphadenopathy:    She has no cervical adenopathy.  Neurological: She is alert and oriented to person, place, and time. She displays normal reflexes. No cranial nerve deficit. She exhibits normal muscle tone. Coordination normal.  Skin: Skin is warm and dry. Capillary refill takes less than 2 seconds.  Psychiatric: She has a normal mood and affect. Her behavior is normal. Judgment and thought content normal.  Nursing note and vitals reviewed.     Assessment/Plan: 84 year old female with newly diagnosed invasive lobular carcinoma on the right side.  Patient with a prior history of left mastectomy for malignant phyllodes and radiation therapy.  Discussed with the patient about  options.  She is adamant that she does not want to go through a mastectomy again and that she rather " die" not having a mastectomy.  She understands that this might be a large mass and the pathology being lobular that may be multifocality.  I am also concerned about the compliant of this patient however she is competent and has undergone a previous mastectomy so I can understand her fears and her frustrations.  I also discussed the case in detail with Dr. Delma Officer from oncology. Her  extensive discussion with the patient we will proceed with a right lumpectomy with sentinel lymph node biopsy.  She understands that she will get hormonal therapy as well   D. Dahlia Byes, MD

## 2017-11-05 NOTE — Progress Notes (Signed)
pts teeth returned

## 2017-11-05 NOTE — Anesthesia Postprocedure Evaluation (Signed)
Anesthesia Post Note  Patient: Brenda Cain  Procedure(s) Performed: BREAST LUMPECTOMY WITH SENTINEL LYMPH NODE BX (Right Breast)  Patient location during evaluation: PACU Anesthesia Type: General Level of consciousness: awake and alert and oriented Pain management: pain level controlled Vital Signs Assessment: post-procedure vital signs reviewed and stable Respiratory status: spontaneous breathing Cardiovascular status: blood pressure returned to baseline Anesthetic complications: no     Last Vitals:  Vitals:   11/05/17 1430 11/05/17 1438  BP: (!) 197/71 (!) 199/69  Pulse: 72 62  Resp: 16   Temp: (!) 36.1 C (!) 36.2 C  SpO2: 93% 94%    Last Pain:  Vitals:   11/05/17 1438  TempSrc: Temporal  PainSc: 5                   [place]

## 2017-11-05 NOTE — Transfer of Care (Signed)
Immediate Anesthesia Transfer of Care Note  Patient: Brenda Cain  Procedure(s) Performed: BREAST LUMPECTOMY WITH SENTINEL LYMPH NODE BX (Right Breast)  Patient Location: PACU  Anesthesia Type:General  Level of Consciousness: awake and alert   Airway & Oxygen Therapy: Patient Spontanous Breathing  Post-op Assessment: Report given to RN and Post -op Vital signs reviewed and stable  Post vital signs: Reviewed and stable  Last Vitals:  Vitals Value Taken Time  BP 195/88 11/05/2017  1:54 PM  Temp 36.6 C 11/05/2017  1:52 PM  Pulse 73 11/05/2017  1:54 PM  Resp 17 11/05/2017  1:52 PM  SpO2 94 % 11/05/2017  1:54 PM  Vitals shown include unvalidated device data.  Last Pain:  Vitals:   11/05/17 1350  TempSrc:   PainSc: 0-No pain         Complications: No apparent anesthesia complications

## 2017-11-05 NOTE — Anesthesia Procedure Notes (Signed)
Procedure Name: Intubation Date/Time: 11/05/2017 12:00 PM Performed by: Johnna Acosta, CRNA Pre-anesthesia Checklist: Patient identified, Emergency Drugs available, Suction available, Patient being monitored and Timeout performed Patient Re-evaluated:Patient Re-evaluated prior to induction Oxygen Delivery Method: Circle system utilized Preoxygenation: Pre-oxygenation with 100% oxygen Induction Type: IV induction Laryngoscope Size: Miller and 2 Grade View: Grade II Tube type: Oral Tube size: 7.0 mm Number of attempts: 1 Airway Equipment and Method: Stylet Placement Confirmation: ETT inserted through vocal cords under direct vision,  positive ETCO2 and breath sounds checked- equal and bilateral Secured at: 21 cm Tube secured with: Tape Dental Injury: Teeth and Oropharynx as per pre-operative assessment

## 2017-11-05 NOTE — Discharge Instructions (Signed)

## 2017-11-05 NOTE — Anesthesia Preprocedure Evaluation (Addendum)
Anesthesia Evaluation  Patient identified by MRN, date of birth, ID band Patient awake    Reviewed: Allergy & Precautions, NPO status , Patient's Chart, lab work & pertinent test results  Airway Mallampati: III  TM Distance: >3 FB     Dental   Pulmonary neg pulmonary ROS,    Pulmonary exam normal        Cardiovascular hypertension, Pt. on medications Normal cardiovascular exam     Neuro/Psych negative neurological ROS  negative psych ROS   GI/Hepatic Neg liver ROS,   Endo/Other  Hypothyroidism   Renal/GU negative Renal ROS  negative genitourinary   Musculoskeletal negative musculoskeletal ROS (+)   Abdominal Normal abdominal exam  (+)   Peds  Hematology negative hematology ROS (+)   Anesthesia Other Findings Past Medical History: 03/27/10: Breast cancer ([place])     Comment:  lt mastectomy/radiation No date: History of radiation therapy No date: Hypertension No date: Hypothyroidism No date: Pre-diabetes No date: Umbilical hernia  Reproductive/Obstetrics                            Anesthesia Physical Anesthesia Plan  ASA: II  Anesthesia Plan: General   Post-op Pain Management:    Induction: Intravenous  PONV Risk Score and Plan:   Airway Management Planned: Oral ETT  Additional Equipment:   Intra-op Plan:   Post-operative Plan: Extubation in OR  Informed Consent: I have reviewed the patients History and Physical, chart, labs and discussed the procedure including the risks, benefits and alternatives for the proposed anesthesia with the patient or authorized representative who has indicated his/her understanding and acceptance.   Dental advisory given  Plan Discussed with: CRNA and Surgeon  Anesthesia Plan Comments:         Anesthesia Quick Evaluation

## 2017-11-05 NOTE — Op Note (Signed)
  Pre-operative Diagnosis: Right Invasive Lobular Breast Cancer     Post-operative Diagnosis: Same  Surgeon: Caroleen Hamman, MD FACS  Anesthesia: GETA  Procedure: Right Partial mastectomy with sentinel node biopsy  Findings: Good wide margins on xray specimen  Estimated Blood Loss: Minimal         Drains: None         Specimens: partial mastectomy with labels, long lateral and short superior; sentinel node for frozen section       Complications: none              Condition: Stable  Procedure Details  The patient was seen again in the Holding Room. The benefits, complications, treatment options, and expected outcomes were discussed with the patient. The risks of bleeding, infection, recurrence of symptoms, failure to resolve symptoms, hematoma, seroma, open wound, cosmetic deformity, and the need for further surgery were discussed.  The patient was taken to Operating Room, identified as Brenda Cain and the procedure verified.  A Time Out was held and the above information confirmed.  Prior to the induction of general anesthesia, antibiotic prophylaxis was administered. VTE prophylaxis was in place. Appropriate anesthesia was then administered and tolerated well. The chest was prepped with Chloraprep and draped in the sterile fashion. The patient was positioned in the supine position.   A visual dye was injected periareolar early under aseptic conditions. Then using the hand-held probe an area of high counts was identified in the axilla, an incision was made and direction by the probe aided in dissection of a lymph node which was sent for pathology.  Attention was turned to the palpable mass where an incision was made encompassing the skin changes around the mass. Dissection around the mass to perform a partial mastectomy with adequate margins was performed. This was done with electrocautery and sharp dissection. Hemostasis was with electrocautery. Additional Marcaine was infiltrated into  the skin and subcutaneous tissues of the cavity. Once assuring that hemostasis was adequate and checked multiple times the wound was closed with interrupted 3-0 Vicryl followed by 4-0 subcuticular Monocryl sutures.  The axillary wound was closed in a similar fashion.   Dermabond was placed  Patient was taken to the recovery room in stable condition . Specimen Xray confirmed mass within the resection tissue with good margins. Pathology confirmed clear margins w anterior margin being the closest.   Caroleen Hamman, MD, FACS

## 2017-11-05 NOTE — Anesthesia Post-op Follow-up Note (Signed)
Anesthesia QCDR form completed.        

## 2017-11-06 ENCOUNTER — Encounter: Payer: Self-pay | Admitting: Surgery

## 2017-11-10 LAB — SURGICAL PATHOLOGY

## 2017-11-17 ENCOUNTER — Other Ambulatory Visit: Payer: Self-pay | Admitting: Family Medicine

## 2017-11-17 DIAGNOSIS — N63 Unspecified lump in unspecified breast: Secondary | ICD-10-CM

## 2017-11-18 ENCOUNTER — Other Ambulatory Visit: Payer: Self-pay | Admitting: Family Medicine

## 2017-11-18 DIAGNOSIS — N63 Unspecified lump in unspecified breast: Secondary | ICD-10-CM

## 2017-11-19 ENCOUNTER — Encounter: Payer: Self-pay | Admitting: Surgery

## 2017-11-19 ENCOUNTER — Encounter: Payer: Self-pay | Admitting: *Deleted

## 2017-11-19 ENCOUNTER — Ambulatory Visit (INDEPENDENT_AMBULATORY_CARE_PROVIDER_SITE_OTHER): Payer: Medicare Other | Admitting: Surgery

## 2017-11-19 ENCOUNTER — Other Ambulatory Visit: Payer: Self-pay

## 2017-11-19 VITALS — BP 187/78 | HR 67 | Temp 97.9°F | Resp 14 | Ht 66.0 in | Wt 223.0 lb

## 2017-11-19 DIAGNOSIS — C50919 Malignant neoplasm of unspecified site of unspecified female breast: Secondary | ICD-10-CM

## 2017-11-19 NOTE — Progress Notes (Signed)
S/p Right lumpectomy  SLNB 10/9 Path d./w pt in detail, negative margins and Node + metastatic disease - INVASIVE PLEOMORPHIC LOBULAR CARCINOMA.  - PLEOMORPHIC LOBULAR CARCINOMA IN SITU WITH FOCAL COMEDO NECROSIS.   She is reluctant to see medical oncology and radiation oncology D/W her about the importance of those f/u She is otherwise doing well  PE NAD Lumpectomy site healing well, mild edema but no abscess or infection  A/p DOing well Oncology and radiation f/u,  ( appts were arranged) we will be available if she may need a port. She is very hesitant about further therapies No complications

## 2017-11-19 NOTE — Patient Instructions (Addendum)
Need to be scheduled with  [facility], GOVINDA R. and Dr. Baruch Gouty .

## 2017-11-19 NOTE — Progress Notes (Signed)
Patient has been scheduled for an appointment with Dr. Baruch Gouty and Dr. Rogue Bussing at the [facility] for 11-24-17 at 10:30 am (she will see Dr. Baruch Gouty first and Dr. Rogue Bussing to follow at 11:30 am).  The patient is aware of date, time, and instructions.

## 2017-11-20 ENCOUNTER — Other Ambulatory Visit: Payer: Self-pay | Admitting: *Deleted

## 2017-11-24 ENCOUNTER — Ambulatory Visit
Admission: RE | Admit: 2017-11-24 | Discharge: 2017-11-24 | Disposition: A | Discharge status: Home / Self Care | Payer: Medicare Other | Source: Ambulatory Visit | Attending: Radiation Oncology | Admitting: Radiation Oncology

## 2017-11-24 ENCOUNTER — Other Ambulatory Visit: Payer: Self-pay

## 2017-11-24 ENCOUNTER — Encounter: Payer: Self-pay | Admitting: Internal Medicine

## 2017-11-24 ENCOUNTER — Inpatient Hospital Stay (HOSPITAL_BASED_OUTPATIENT_CLINIC_OR_DEPARTMENT_OTHER): Payer: Medicare Other | Admitting: Internal Medicine

## 2017-11-24 ENCOUNTER — Encounter: Payer: Self-pay | Admitting: Radiation Oncology

## 2017-11-24 VITALS — BP 187/81 | HR 69 | Resp 16

## 2017-11-24 VITALS — BP 163/80 | HR 72 | Temp 97.3°F | Resp 16 | Wt 219.5 lb

## 2017-11-24 DIAGNOSIS — I10 Essential (primary) hypertension: Secondary | ICD-10-CM | POA: Insufficient documentation

## 2017-11-24 DIAGNOSIS — E039 Hypothyroidism, unspecified: Secondary | ICD-10-CM | POA: Diagnosis not present

## 2017-11-24 DIAGNOSIS — K449 Diaphragmatic hernia without obstruction or gangrene: Secondary | ICD-10-CM | POA: Insufficient documentation

## 2017-11-24 DIAGNOSIS — Z170 Estrogen receptor positive status [ER+]: Secondary | ICD-10-CM | POA: Diagnosis not present

## 2017-11-24 DIAGNOSIS — C50211 Malignant neoplasm of upper-inner quadrant of right female breast: Secondary | ICD-10-CM

## 2017-11-24 DIAGNOSIS — Z87891 Personal history of nicotine dependence: Secondary | ICD-10-CM | POA: Insufficient documentation

## 2017-11-24 NOTE — Consult Note (Signed)
NEW PATIENT EVALUATION  Name: Brenda Cain  MRN: [id number]  Date:   11/24/2017     DOB: 11-Jan-1934   This 84 y.o. 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 Breast Cancer    Pt is here for initial consultation of breast cancer    DIAGNOSIS: The encounter diagnosis was Carcinoma of upper-inner quadrant of right breast in female, estrogen receptor positive ([place]).   PREVIOUS INVESTIGATIONS:  Pathology report reviewed Mammogram and ultrasound reviewed Clinical notes reviewed  HPI: Patient is an 84 year old female well-known to our department have been present previously received radiation therapy to her left chest wall and peripheral lymphatics for invasive mammary carcinoma status post left modified radical mastectomy.  She recently presented with abnormal mammogram of her right breast showing highly suspicious enlarging mass in the right breast at the 12:30 position possibly involving the skin.  No evidence of right axillary lymphadenopathy was noted.  Biopsy was positive for lobular carcinoma.  She went on to have a wide local excision and sentinel node biopsy.  Tumor was an invasive pleomorphic lobular carcinoma measuring 2.2 cm.  Tumor was overall grade 3.  Margins were clear but close at 1 mm.  Invasive carcinoma directly invaded into the dermis without skin ulceration.  One sentinel lymph node was positive for metastatic disease.  She is doing well postoperatively.  She does have ambulation issues.  She specifically denies breast tenderness cough or bone pain.  Patient stated she is very adverse to any cyst adjuvant treatment at this time although does have an appointment with medical oncology later today.  PLANNED TREATMENT  REGIMEN: Possible systemic chemotherapy and adjuvant whole breast and peripheral lymphatic radiation  PAST MEDICAL HISTORY:  has a past medical history of Breast cancer ([place]) (03/27/10), History of radiation therapy, Hypertension, Hypothyroidism, Pre-diabetes, and Umbilical hernia.    PAST SURGICAL HISTORY:  Past Surgical History:  Procedure Laterality Date  . BREAST BIOPSY Right 09/16/2017   breast cancer  . BREAST EXCISIONAL BIOPSY Right 11/05/2017   lumpectomy   . BREAST LUMPECTOMY WITH SENTINEL LYMPH NODE BIOPSY Right 11/05/2017   Procedure: BREAST LUMPECTOMY WITH SENTINEL LYMPH NODE BX;  Surgeon: Jules Husbands, MD;  Location: [facility] ORS;  Service: General;  Laterality: Right;  . HERNIA REPAIR    . MASTECTOMY Left 06/19/2010   had radiation    FAMILY HISTORY: family history includes Diabetes in her sister; Hypertension in her father and sister.  SOCIAL HISTORY:  reports that she has never smoked. She has never used smokeless tobacco. She reports that she does not drink alcohol or use drugs.  ALLERGIES: Aspartame; Nickel; Penicillins; Orange oil; Red dye; Sheep allergy; and Yellow dye  MEDICATIONS:  Current Outpatient Medications  Medication Sig Dispense Refill  . aspirin EC 81 MG tablet Take 1 tablet by mouth daily.    Marland Kitchen CALCIUM-MAGNESIUM-ZINC PO Take by mouth daily.    Marland Kitchen levothyroxine (SYNTHROID, LEVOTHROID) 50 MCG tablet Take 1 tablet by mouth daily.    Marland Kitchen lisinopril (PRINIVIL,ZESTRIL) 10 MG tablet Take 10 mg by mouth.    . torsemide (DEMADEX) 5 MG tablet Take 5 mg by mouth daily.    . Vitamin D, Cholecalciferol, 400 units TABS Take by mouth daily.    Marland Kitchen  HYDROcodone-acetaminophen (NORCO/VICODIN) 5-325 MG tablet Take 1-2 tablets by mouth every 6 (six) hours as needed for moderate pain. (Patient not taking: Reported on 11/24/2017) 30 tablet 0   No current facility-administered medications for this encounter.     ECOG PERFORMANCE STATUS:  0 - Asymptomatic  REVIEW OF  SYSTEMS: Patient denies any weight loss, fatigue, weakness, fever, chills or night sweats. Patient denies any loss of vision, blurred vision. Patient denies any ringing  of the ears or hearing loss. No irregular heartbeat. Patient denies heart murmur or history of fainting. Patient denies any chest pain or pain radiating to her upper extremities. Patient denies any shortness of breath, difficulty breathing at night, cough or hemoptysis. Patient denies any swelling in the lower legs. Patient denies any nausea vomiting, vomiting of blood, or coffee ground material in the vomitus. Patient denies any stomach pain. Patient states has had normal bowel movements no significant constipation or diarrhea. Patient denies any dysuria, hematuria or significant nocturia. Patient denies any problems walking, swelling in the joints or loss of balance. Patient denies any skin changes, loss of hair or loss of weight. Patient denies any excessive worrying or anxiety or significant depression. Patient denies any problems with insomnia. Patient denies excessive thirst, polyuria, polydipsia. Patient denies any swollen glands, patient denies easy bruising or easy bleeding. Patient denies any recent infections, allergies or URI. Patient "s visual fields have not changed significantly in recent time.   PHYSICAL EXAM: BP (!) 163/80 (BP Location: Right Arm, Patient Position: Sitting)   Pulse 72   Temp (!) 97.3 F (36.3 C) (Tympanic)   Resp 16   Wt 219 lb 7.5 oz (99.6 kg)   BMI 35.42 kg/m  Patient is status post left modified radical mastectomy left chest wall is clear.  She is status post extensive surgery of the right breast with only a fair cosmetic outcome.  No dominant mass or nodularity is noted in the right breast.  No axillary or supraclavicular adenopathy is identified.  Well-developed well-nourished patient in NAD. HEENT reveals PERLA, EOMI, discs not visualized.  Oral cavity is clear. No oral mucosal lesions are  identified. Neck is clear without evidence of cervical or supraclavicular adenopathy. Lungs are clear to A&P. Cardiac examination is essentially unremarkable with regular rate and rhythm without murmur rub or thrill. Abdomen is benign with no organomegaly or masses noted. Motor sensory and DTR levels are equal and symmetric in the upper and lower extremities. Cranial nerves II through XII are grossly intact. Proprioception is intact. No peripheral adenopathy or edema is identified. No motor or sensory levels are noted. Crude visual fields are within normal range.  LABORATORY DATA: Pathology reports reviewed    RADIOLOGY RESULTS: Mammogram and ultrasound reviewed   IMPRESSION: Stage III (T2 N1 M0) pleomorphic lobular carcinoma of the right breast status post wide local excision and sentinel node biopsy.  Tumor strongly ER PR positive  PLAN: At this time patient will be seen by medical oncology for opinion regarding systemic therapy.  I would recommend adjuvant radiation therapy to her right breast and peripheraon lymphatics.  I would also boost her scar another 1600 cGy electron beam based on the close 1 mm margin.  Will await medical oncology's opinion regarding systemic chemotherapy.  Do not think she needs any further surgery as I will incorporate her axilla which is negative on ultrasound examination in my treatment fields.  Risks and benefits of treatment including skin reaction fatigue alteration of blood counts and possible slight chance  of lymphedema of the right upper extremity all were discussed with the patient.  I will await medical oncology's opinion before deciding on simulation.  Patient comprehends my treatment plan well.  Patient also will benefit from antiestrogen therapy after completion of adjuvant treatment.  I would like to take this opportunity to thank you for allowing me to participate in the care of your patient.Marland Kitchen          l lymphatics treated in both the 5040 cGy in 28  fractions boosting her scar another 1600 centigrade based

## 2017-11-24 NOTE — Progress Notes (Signed)
[facility] OFFICE PROGRESS NOTE  Patient Care Team: Valera Castle, MD as PCP - General (Family Medicine)  Cancer Staging No matching staging information was found for the patient.   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 [s/p Mastec]; LVI- indeterminate s/p RT  # Right Breast 12'30- ~ 2cm [may 2018]-PLEOMORPHIC INVASIVE LOBULAR CANCER; ER> 90%/PR11% her2 Neu-NEG;  s/p lumpectomy [Dr.Pabon];pT2 pN78m(sn); STAGE I; Tumor Extension: Skin: invasive carcinoma directly invades into the dermis without skin ulceration; Margin-Negative;  # Nov 2019- RT  # Thyroid nodules- f/u PCP  # Non-compliance  DIAGNOSIS: RIGHT BREAST CA/lobular  STAGE:  I ; GOALS: curative  CURRENT/MOST RECENT THERAPY: RT      Malignant phyllodes tumor of breast, left ([facility])    Carcinoma of upper-inner quadrant of right breast in [place], [place]      INTERVAL HISTORY:  Brenda [id number].[id number].  female pleasant patient above history of newly diagnosed right breast cancer is here for follow-up.  Patient has met with radiation oncology this morning.  Patient is recovering well from surgery.   Review of Systems  Constitutional: Positive for malaise/fatigue. Negative for chills, diaphoresis, fever and weight loss.  HENT: Negative for nosebleeds and sore throat.   Eyes: Negative for double vision.  Respiratory: Negative for cough, hemoptysis, sputum production, shortness of breath and wheezing.   Cardiovascular: Negative for chest pain, palpitations, orthopnea and leg swelling.  Gastrointestinal: Negative for abdominal pain, blood in stool, constipation, diarrhea, heartburn, melena, nausea and vomiting.  Genitourinary: Negative for dysuria, frequency and urgency.  Musculoskeletal: Positive for back pain and joint pain.  Skin: Negative.  Negative for itching and rash.  Neurological: Negative for dizziness,  tingling, focal weakness, weakness and headaches.  Endo/Heme/Allergies: Does not bruise/bleed easily.  Psychiatric/Behavioral: Negative for depression. The patient is not nervous/anxious and does not have insomnia.       PAST MEDICAL HISTORY :  Past Medical History:  Diagnosis Date  . [place]   lt mastectomy/radiation  . History of radiation therapy   . Hypertension   . Hypothyroidism   . Pre-diabetes   . Umbilical hernia     PAST SURGICAL HISTORY :   Past Surgical History:  Procedure Laterality Date  . BREAST BIOPSY Right 09/16/2017   breast cancer  . BREAST EXCISIONAL BIOPSY Right 11/05/2017   lumpectomy   . BREAST LUMPECTOMY WITH SENTINEL LYMPH NODE BIOPSY Right 11/05/2017   Procedure: BREAST LUMPECTOMY WITH SENTINEL LYMPH NODE BX;  Surgeon: PJules Husbands MD;  Location: [facility] ORS;  Service: General;  Laterality: Right;  . HERNIA REPAIR    . MASTECTOMY Left 06/19/2010   had radiation    FAMILY HISTORY :   Family History  Problem Relation Age of Onset  . Hypertension Father   . Hypertension Sister   . Diabetes Sister   . Breast cancer Neg Hx     SOCIAL HISTORY:   Social History   Tobacco Use  . Smoking status: Never Smoker  . Smokeless tobacco: Never Used  Substance Use Topics  . Alcohol use: No    Alcohol/week: 0.0 standard drinks  . Drug use: No    ALLERGIES:  is allergic to aspartame; nickel; penicillins; orange oil; red dye; sheep allergy; and yellow dye.  MEDICATIONS:  Current Outpatient Medications  Medication Sig Dispense Refill  . aspirin EC 81 MG tablet Take 1 tablet by mouth daily.    .Marland Kitchen  CALCIUM-MAGNESIUM-ZINC PO Take by mouth daily.    Marland Kitchen levothyroxine (SYNTHROID, LEVOTHROID) 50 MCG tablet Take 1 tablet by mouth daily.    Marland Kitchen lisinopril (PRINIVIL,ZESTRIL) 10 MG tablet Take 10 mg by mouth.    . torsemide (DEMADEX) 5 MG tablet Take 5 mg by mouth daily.    . Vitamin D, Cholecalciferol, 400 units TABS Take by mouth daily.    Marland Kitchen  HYDROcodone-acetaminophen (NORCO/VICODIN) 5-325 MG tablet Take 1-2 tablets by mouth every 6 (six) hours as needed for moderate pain. (Patient not taking: Reported on 11/24/2017) 30 tablet 0   No current facility-administered medications for this visit.     PHYSICAL EXAMINATION: ECOG PERFORMANCE STATUS: 0 - Asymptomatic  BP (!) 187/81 (BP Location: Left Arm, Patient Position: Sitting)   Pulse 69   Resp 16   There were no vitals filed for this visit.  Physical Exam  Constitutional: She is oriented to person, place, and time and well-developed, well-nourished, and in no distress.  In a wheelchair.  Walks with a cane.  Alone.  HENT:  Head: Normocephalic and atraumatic.  Mouth/Throat: Oropharynx is clear and moist. No oropharyngeal exudate.  Eyes: Pupils are equal, round, and reactive to light.  Neck: Normal range of motion. Neck supple.  Cardiovascular: Normal rate and regular rhythm.  Pulmonary/Chest: No respiratory distress. She has no wheezes.  Abdominal: Soft. Bowel sounds are normal. She exhibits no distension and no mass. There is no tenderness. There is no rebound and no guarding.  Musculoskeletal: Normal range of motion. She exhibits no edema or tenderness.  Neurological: She is alert and oriented to person, place, and time.  Skin: Skin is warm.  Psychiatric:  Flat affect.       LABORATORY DATA:  I have reviewed the data as listed    Component Value Date/Time   NA 142 10/14/2017 0906   NA 142 04/18/2011 1101   K 4.1 10/14/2017 0906   K 4.2 04/18/2011 1101   CL 104 10/14/2017 0906   CL 102 04/18/2011 1101   CO2 31 10/14/2017 0906   CO2 33 (H) 04/18/2011 1101   GLUCOSE 102 (H) 10/14/2017 0906   GLUCOSE 118 (H) 04/18/2011 1101   BUN 18 10/14/2017 0906   BUN 21 (H) 04/18/2011 1101   CREATININE 0.75 10/14/2017 0906   CREATININE 0.61 05/05/2014 0853   CALCIUM 9.2 10/14/2017 0906   CALCIUM 9.0 04/18/2011 1101   PROT 7.7 05/13/2017 1003   PROT 7.3 05/05/2014 0853    ALBUMIN 4.0 05/13/2017 1003   ALBUMIN 4.2 05/05/2014 0853   AST 18 05/13/2017 1003   AST 13 (L) 05/05/2014 0853   ALT 13 (L) 05/13/2017 1003   ALT 10 (L) 05/05/2014 0853   ALKPHOS 55 05/13/2017 1003   ALKPHOS 50 05/05/2014 0853   BILITOT 0.5 05/13/2017 1003   BILITOT 0.7 05/05/2014 0853   GFRNONAA >60 10/14/2017 0906   GFRNONAA >60 05/05/2014 0853   GFRAA >60 10/14/2017 0906   GFRAA >60 05/05/2014 0853    No results found for: SPEP, UPEP  Lab Results  Component Value Date   WBC 4.5 10/14/2017   NEUTROABS 3.0 05/13/2017   HGB 12.5 10/14/2017   HCT 37.3 10/14/2017   MCV 88.5 10/14/2017   PLT 205 10/14/2017      Chemistry      Component Value Date/Time   NA 142 10/14/2017 0906   NA 142 04/18/2011 1101   K 4.1 10/14/2017 0906   K 4.2 04/18/2011 1101   CL 104 10/14/2017  0906   CL 102 04/18/2011 1101   CO2 31 10/14/2017 0906   CO2 33 (H) 04/18/2011 1101   BUN 18 10/14/2017 0906   BUN 21 (H) 04/18/2011 1101   CREATININE 0.75 10/14/2017 0906   CREATININE 0.61 05/05/2014 0853      Component Value Date/Time   CALCIUM 9.2 10/14/2017 0906   CALCIUM 9.0 04/18/2011 1101   ALKPHOS 55 05/13/2017 1003   ALKPHOS 50 05/05/2014 0853   AST 18 05/13/2017 1003   AST 13 (L) 05/05/2014 0853   ALT 13 (L) 05/13/2017 1003   ALT 10 (L) 05/05/2014 0853   BILITOT 0.5 05/13/2017 1003   BILITOT 0.7 05/05/2014 0853       RADIOGRAPHIC STUDIES: I have personally reviewed the radiological images as listed and agreed with the findings in the report. No results found.   ASSESSMENT & PLAN:  Carcinoma of upper-inner quadrant of right breast in female, estrogen receptor positive ([place]) #Right Breast lobular cancer- s/p Lumpectomy; pT2pN24m [sn]; clear margins.  Patient is a poor candidate for chemotherapy-given significant noncompliance/frail status/age.  Hence I would not check for Oncotype/mamma print-as she is not a candidate for chemotherapy.  #Agree with radiation oncology  evaluation/proceed with whole breast radiation treatment of regional lymphatics.  Discussed with Dr. CDonella Stade  #With regards to systemic adjuvant therapy would recommend aromatase inhibitor after finishing radiation.  Discussed with the patient that the goal of treatment is cure.  #Elevated blood [id number] poorly controlled.  Defer to PCP.  # DISPOSITION:  follow up in 6 week/labs- cbc/cmp- Dr.B; discussed with breast navigator's.  The above plan of care was discussed the patient detail.  She agrees.   No orders of the defined types were placed in this encounter.  All questions were answered. The patient knows to call the clinic with any problems, questions or concerns.      GCammie Sickle MD 11/25/2017 7:47 AM

## 2017-11-24 NOTE — Assessment & Plan Note (Addendum)
#  Right Breast lobular cancer- s/p Lumpectomy; pT2pN1mi [sn]; clear margins.  Patient is a poor candidate for chemotherapy-given significant noncompliance/frail status/age.  Hence I would not check for Oncotype/mamma print-as she is not a candidate for chemotherapy.  #Agree with radiation oncology evaluation/proceed with whole breast radiation treatment of regional lymphatics.  Discussed with Dr. Crystal.  #With regards to systemic adjuvant therapy would recommend aromatase inhibitor after finishing radiation.  Discussed with the patient that the goal of treatment is cure.  #Elevated blood pressure-190 systolic poorly controlled.  Defer to PCP.  # DISPOSITION:  follow up in 6 week/labs- cbc/cmp- Dr.B; discussed with breast navigator's.  The above plan of care was discussed the patient detail.  She agrees. 

## 2017-12-15 ENCOUNTER — Other Ambulatory Visit: Payer: Medicare Other

## 2018-01-02 ENCOUNTER — Other Ambulatory Visit: Payer: Self-pay | Admitting: Internal Medicine

## 2018-01-02 DIAGNOSIS — C50211 Malignant neoplasm of upper-inner quadrant of right female breast: Secondary | ICD-10-CM

## 2018-01-02 DIAGNOSIS — Z170 Estrogen receptor positive status [ER+]: Principal | ICD-10-CM

## 2018-01-05 ENCOUNTER — Inpatient Hospital Stay: Payer: Medicare Other | Admitting: Internal Medicine

## 2018-01-05 ENCOUNTER — Inpatient Hospital Stay: Payer: Medicare Other

## 2018-01-05 NOTE — Assessment & Plan Note (Deleted)
#  Right Breast lobular cancer- s/p Lumpectomy; pT2pN62mi [sn]; clear margins.  Patient is a poor candidate for chemotherapy-given significant noncompliance/frail status/age.  Hence I would not check for Oncotype/mamma print-as she is not a candidate for chemotherapy.  #Agree with radiation oncology evaluation/proceed with whole breast radiation treatment of regional lymphatics.  Discussed with Dr. Donella Stade.  #With regards to systemic adjuvant therapy would recommend aromatase inhibitor after finishing radiation.  Discussed with the patient that the goal of treatment is cure.  #Elevated blood [id number] systolic poorly controlled.  Defer to PCP.  # DISPOSITION:  follow up in 6 week/labs- cbc/cmp- Dr.B; discussed with breast navigator's.  The above plan of care was discussed the patient detail.  She agrees.

## 2018-01-05 NOTE — Progress Notes (Deleted)
[facility] OFFICE PROGRESS NOTE  Patient Care Team: Valera Castle, MD as PCP - General (Family Medicine)  Cancer Staging No matching staging information was found for the patient.   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 [s/p Mastec]; LVI- indeterminate s/p RT  # Right Breast 12'30- ~ 2cm [may 2018]-PLEOMORPHIC INVASIVE LOBULAR CANCER; ER> 90%/PR11% her2 Neu-NEG;  s/p lumpectomy [Dr.Pabon];pT2 pN78m(sn); STAGE I; Tumor Extension: Skin: invasive carcinoma directly invades into the dermis without skin ulceration; Margin-Negative;  # Nov 2019- RT  # Thyroid nodules- f/u PCP  # Non-compliance  DIAGNOSIS: RIGHT BREAST CA/lobular  STAGE:  I ; GOALS: curative  CURRENT/MOST RECENT THERAPY: RT      Malignant phyllodes tumor of breast, left (HCC)    Carcinoma of upper-inner quadrant of right breast in [place], estrogen receptor positive [place]      INTERVAL HISTORY:  [name].  female pleasant patient above history of newly diagnosed right breast cancer is here for follow-up.  Patient has met with radiation oncology this morning.  Patient is recovering well from surgery.   Review of Systems  Constitutional: Positive for malaise/fatigue. Negative for chills, diaphoresis, fever and weight loss.  HENT: Negative for nosebleeds and sore throat.   Eyes: Negative for double vision.  Respiratory: Negative for cough, hemoptysis, sputum production, shortness of breath and wheezing.   Cardiovascular: Negative for chest pain, palpitations, orthopnea and leg swelling.  Gastrointestinal: Negative for abdominal pain, blood in stool, constipation, diarrhea, heartburn, melena, nausea and vomiting.  Genitourinary: Negative for dysuria, frequency and urgency.  Musculoskeletal: Positive for back pain and joint pain.  Skin: Negative.  Negative for itching and rash.  Neurological: Negative for dizziness,  tingling, focal weakness, weakness and headaches.  Endo/Heme/Allergies: Does not bruise/bleed easily.  Psychiatric/Behavioral: Negative for depression. The patient is not nervous/anxious and does not have insomnia.       PAST MEDICAL HISTORY :  Past Medical History:  Diagnosis Date  . [place]   lt mastectomy/radiation  . History of radiation therapy   . Hypertension   . Hypothyroidism   . Pre-diabetes   . Umbilical hernia     PAST SURGICAL HISTORY :   Past Surgical History:  Procedure Laterality Date  . BREAST BIOPSY Right 09/16/2017   breast cancer  . BREAST EXCISIONAL BIOPSY Right 11/05/2017   lumpectomy   . BREAST LUMPECTOMY WITH SENTINEL LYMPH NODE BIOPSY Right 11/05/2017   Procedure: BREAST LUMPECTOMY WITH SENTINEL LYMPH NODE BX;  Surgeon: PJules Husbands MD;  Location: [facility] ORS;  Service: General;  Laterality: Right;  . HERNIA REPAIR    . MASTECTOMY Left 06/19/2010   had radiation    FAMILY HISTORY :   Family History  Problem Relation Age of Onset  . Hypertension Father   . Hypertension Sister   . Diabetes Sister   . Breast cancer Neg Hx     SOCIAL HISTORY:   Social History   Tobacco Use  . Smoking status: Never Smoker  . Smokeless tobacco: Never Used  Substance Use Topics  . Alcohol use: No    Alcohol/week: 0.0 standard drinks  . Drug use: No    ALLERGIES:  is allergic to aspartame; nickel; penicillins; orange oil; red dye; sheep allergy; and yellow dye.  MEDICATIONS:  Current Outpatient Medications  Medication Sig Dispense Refill  . aspirin EC 81 MG tablet Take 1 tablet by mouth daily.    .Marland Kitchen  CALCIUM-MAGNESIUM-ZINC PO Take by mouth daily.    Marland Kitchen HYDROcodone-acetaminophen (NORCO/VICODIN) 5-325 MG tablet Take 1-2 tablets by mouth every 6 (six) hours as needed for moderate pain. (Patient not taking: Reported on 11/24/2017) 30 tablet 0  . levothyroxine (SYNTHROID, LEVOTHROID) 50 MCG tablet Take 1 tablet by mouth daily.    Marland Kitchen lisinopril  (PRINIVIL,ZESTRIL) 10 MG tablet Take 10 mg by mouth.    . torsemide (DEMADEX) 5 MG tablet Take 5 mg by mouth daily.    . Vitamin D, Cholecalciferol, 400 units TABS Take by mouth daily.     No current facility-administered medications for this visit.     PHYSICAL EXAMINATION: ECOG PERFORMANCE STATUS: 0 - Asymptomatic  There were no vitals taken for this visit.  There were no vitals filed for this visit.  Physical Exam  Constitutional: She is oriented to person, place, and time and well-developed, well-nourished, and in no distress.  In a wheelchair.  Walks with a cane.  Alone.  HENT:  Head: Normocephalic and atraumatic.  Mouth/Throat: Oropharynx is clear and moist. No oropharyngeal exudate.  Eyes: Pupils are equal, round, and reactive to light.  Neck: Normal range of motion. Neck supple.  Cardiovascular: Normal rate and regular rhythm.  Pulmonary/Chest: No respiratory distress. She has no wheezes.  Abdominal: Soft. Bowel sounds are normal. She exhibits no distension and no mass. There is no tenderness. There is no rebound and no guarding.  Musculoskeletal: Normal range of motion. She exhibits no edema or tenderness.  Neurological: She is alert and oriented to person, place, and time.  Skin: Skin is warm.  Psychiatric:  Flat affect.       LABORATORY DATA:  I have reviewed the data as listed    Component Value Date/Time   NA 142 10/14/2017 0906   NA 142 04/18/2011 1101   K 4.1 10/14/2017 0906   K 4.2 04/18/2011 1101   CL 104 10/14/2017 0906   CL 102 04/18/2011 1101   CO2 31 10/14/2017 0906   CO2 33 (H) 04/18/2011 1101   GLUCOSE 102 (H) 10/14/2017 0906   GLUCOSE 118 (H) 04/18/2011 1101   BUN 18 10/14/2017 0906   BUN 21 (H) 04/18/2011 1101   CREATININE 0.75 10/14/2017 0906   CREATININE 0.61 05/05/2014 0853   CALCIUM 9.2 10/14/2017 0906   CALCIUM 9.0 04/18/2011 1101   PROT 7.7 05/13/2017 1003   PROT 7.3 05/05/2014 0853   ALBUMIN 4.0 05/13/2017 1003   ALBUMIN 4.2  05/05/2014 0853   AST 18 05/13/2017 1003   AST 13 (L) 05/05/2014 0853   ALT 13 (L) 05/13/2017 1003   ALT 10 (L) 05/05/2014 0853   ALKPHOS 55 05/13/2017 1003   ALKPHOS 50 05/05/2014 0853   BILITOT 0.5 05/13/2017 1003   BILITOT 0.7 05/05/2014 0853   GFRNONAA >60 10/14/2017 0906   GFRNONAA >60 05/05/2014 0853   GFRAA >60 10/14/2017 0906   GFRAA >60 05/05/2014 0853    No results found for: SPEP, UPEP  Lab Results  Component Value Date   WBC 4.5 10/14/2017   NEUTROABS 3.0 05/13/2017   HGB 12.5 10/14/2017   HCT 37.3 10/14/2017   MCV 88.5 10/14/2017   PLT 205 10/14/2017      Chemistry      Component Value Date/Time   NA 142 10/14/2017 0906   NA 142 04/18/2011 1101   K 4.1 10/14/2017 0906   K 4.2 04/18/2011 1101   CL 104 10/14/2017 0906   CL 102 04/18/2011 1101   CO2 31  10/14/2017 0906   CO2 33 (H) 04/18/2011 1101   BUN 18 10/14/2017 0906   BUN 21 (H) 04/18/2011 1101   CREATININE 0.75 10/14/2017 0906   CREATININE 0.61 05/05/2014 0853      Component Value Date/Time   CALCIUM 9.2 10/14/2017 0906   CALCIUM 9.0 04/18/2011 1101   ALKPHOS 55 05/13/2017 1003   ALKPHOS 50 05/05/2014 0853   AST 18 05/13/2017 1003   AST 13 (L) 05/05/2014 0853   ALT 13 (L) 05/13/2017 1003   ALT 10 (L) 05/05/2014 0853   BILITOT 0.5 05/13/2017 1003   BILITOT 0.7 05/05/2014 0853       RADIOGRAPHIC STUDIES: I have personally reviewed the radiological images as listed and agreed with the findings in the report. No results found.   ASSESSMENT & PLAN:  No problem-specific Assessment & Plan notes found for this encounter.   No orders of the defined types were placed in this encounter.  All questions were answered. The patient knows to call the clinic with any problems, questions or concerns.      Cammie Sickle, MD 01/05/2018 7:30 AM

## 2018-01-07 ENCOUNTER — Ambulatory Visit: Payer: Medicare Other | Admitting: Radiation Oncology

## 2018-01-13 ENCOUNTER — Ambulatory Visit
Admission: RE | Admit: 2018-01-13 | Discharge: 2018-01-13 | Disposition: A | Discharge status: Home / Self Care | Payer: Medicare Other | Source: Ambulatory Visit | Attending: Radiation Oncology | Admitting: Radiation Oncology

## 2018-01-13 ENCOUNTER — Other Ambulatory Visit: Payer: Self-pay

## 2018-01-13 ENCOUNTER — Encounter: Payer: Self-pay | Admitting: Radiation Oncology

## 2018-01-13 VITALS — BP 201/84 | HR 71 | Temp 96.7°F | Resp 18 | Wt 222.6 lb

## 2018-01-13 DIAGNOSIS — Z170 Estrogen receptor positive status [ER+]: Secondary | ICD-10-CM | POA: Insufficient documentation

## 2018-01-13 DIAGNOSIS — C50211 Malignant neoplasm of upper-inner quadrant of right female breast: Secondary | ICD-10-CM

## 2018-01-13 NOTE — Progress Notes (Signed)
.  Radiation Oncology Follow up Note  Name: Brenda Cain   Date:   01/13/2018 MRN:  [id number] DOB: 11/12/1933    This 84 y.o. female presents to the clinic today for reevaluation for patient with.stage IIIa (T2 N1 M0). Pleomorphic lobular carcinoma the right breast strongly ER/PR positive status post wide local excision and sentinel node biopsy.  REFERRING PROVIDER: Valera Castle, *  HPI: patient was originally consult back in October 28 when she presented with a stage III pleomorphic lobular carcinoma the right breast. I recommended right breast and peripheral lymphatic radiation and boosting her scar 1600 cGy electrons based on the close 1 mm margin. She was seen by medical oncology based on her multiple comorbiditiesand lobular nature of her disease not thought to be candidate.for systemic chemotherapy. Patient is seen out today for discussion of proceeding with right breast and peripheral sciatic radiation she is otherwise doing well somewhat fatigued but specifically denies breast tenderness cough or bone pain.  COMPLICATIONS OF TREATMENT: none  FOLLOW UP COMPLIANCE: keeps appointments   PHYSICAL EXAM:  BP (!) 201/84 (BP Location: Left Arm, Patient Position: Sitting)   Pulse 71   Temp (!) 96.7 F (35.9 C) (Tympanic)   Resp 18   Wt 222 lb 8.9 oz (101 kg)   BMI 35.92 kg/m  Patient is status post left modified radical mastectomy right breast is healing well no dominant mass or nodularity is noted no axillary or supraclavicular adenopathy is identified.Well-developed well-nourished patient in NAD. HEENT reveals PERLA, EOMI, discs not visualized.  Oral cavity is clear. No oral mucosal lesions are identified. Neck is clear without evidence of cervical or supraclavicular adenopathy. Lungs are clear to A&P. Cardiac examination is essentially unremarkable with regular rate and rhythm without murmur rub or thrill. Abdomen is benign with no organomegaly or masses noted. Motor sensory and  DTR levels are equal and symmetric in the upper and lower extremities. Cranial nerves II through XII are grossly intact. Proprioception is intact. No peripheral adenopathy or edema is identified. No motor or sensory levels are noted. Crude visual fields are within normal range.  RADIOLOGY RESULTS: no current films for review  PLAN: at this time as outlined in my original nodal go ahead with right breast and peripheral lymphatic radiation therapy to 5040 cGy 28 fractions. Would also boost her scar 1600 cGy using electron beam risks and benefits of treatment including skin reaction fatigue alteration of blood counts possible inclusion of superficial lung and slight chance of lymphedema of her right upper extremity all were discussed in detail with the patient. I have personally set up and ordered CT simulation for later this week. Patient comprehends my treatment plan well.  I would like to take this opportunity to thank you for allowing me to participate in the care of your patient.[name], MD

## 2018-01-14 ENCOUNTER — Ambulatory Visit
Admission: RE | Admit: 2018-01-14 | Discharge: 2018-01-14 | Disposition: A | Discharge status: Home / Self Care | Payer: Medicare Other | Source: Ambulatory Visit | Attending: Radiation Oncology | Admitting: Radiation Oncology

## 2018-01-14 DIAGNOSIS — C50919 Malignant neoplasm of unspecified site of unspecified female breast: Secondary | ICD-10-CM | POA: Insufficient documentation

## 2018-01-15 DIAGNOSIS — C50919 Malignant neoplasm of unspecified site of unspecified female breast: Secondary | ICD-10-CM | POA: Diagnosis not present

## 2018-01-19 ENCOUNTER — Ambulatory Visit: Payer: Medicare Other

## 2018-01-23 ENCOUNTER — Other Ambulatory Visit: Payer: Self-pay | Admitting: *Deleted

## 2018-01-23 DIAGNOSIS — Z170 Estrogen receptor positive status [ER+]: Principal | ICD-10-CM

## 2018-01-23 DIAGNOSIS — C50211 Malignant neoplasm of upper-inner quadrant of right female breast: Secondary | ICD-10-CM

## 2018-01-26 ENCOUNTER — Ambulatory Visit
Admission: RE | Admit: 2018-01-26 | Discharge: 2018-01-26 | Disposition: A | Discharge status: Home / Self Care | Payer: Medicare Other | Source: Ambulatory Visit | Attending: Radiation Oncology | Admitting: Radiation Oncology

## 2018-01-26 DIAGNOSIS — C50919 Malignant neoplasm of unspecified site of unspecified female breast: Secondary | ICD-10-CM | POA: Diagnosis not present

## 2018-01-29 ENCOUNTER — Ambulatory Visit
Admission: RE | Admit: 2018-01-29 | Discharge: 2018-01-29 | Disposition: A | Discharge status: Home / Self Care | Payer: Medicare Other | Source: Ambulatory Visit | Attending: Radiation Oncology | Admitting: Radiation Oncology

## 2018-01-29 DIAGNOSIS — C50919 Malignant neoplasm of unspecified site of unspecified female breast: Secondary | ICD-10-CM | POA: Insufficient documentation

## 2018-01-30 ENCOUNTER — Ambulatory Visit
Admission: RE | Admit: 2018-01-30 | Discharge: 2018-01-30 | Disposition: A | Discharge status: Home / Self Care | Payer: Medicare Other | Source: Ambulatory Visit | Attending: Radiation Oncology | Admitting: Radiation Oncology

## 2018-01-30 DIAGNOSIS — C50919 Malignant neoplasm of unspecified site of unspecified female breast: Secondary | ICD-10-CM | POA: Diagnosis not present

## 2018-02-02 ENCOUNTER — Ambulatory Visit
Admission: RE | Admit: 2018-02-02 | Discharge: 2018-02-02 | Disposition: A | Discharge status: Home / Self Care | Payer: Medicare Other | Source: Ambulatory Visit | Attending: Radiation Oncology | Admitting: Radiation Oncology

## 2018-02-02 DIAGNOSIS — C50919 Malignant neoplasm of unspecified site of unspecified female breast: Secondary | ICD-10-CM | POA: Diagnosis not present

## 2018-02-03 ENCOUNTER — Ambulatory Visit
Admission: RE | Admit: 2018-02-03 | Discharge: 2018-02-03 | Disposition: A | Discharge status: Home / Self Care | Payer: Medicare Other | Source: Ambulatory Visit | Attending: Radiation Oncology | Admitting: Radiation Oncology

## 2018-02-03 DIAGNOSIS — C50919 Malignant neoplasm of unspecified site of unspecified female breast: Secondary | ICD-10-CM | POA: Diagnosis not present

## 2018-02-04 ENCOUNTER — Ambulatory Visit
Admission: RE | Admit: 2018-02-04 | Discharge: 2018-02-04 | Disposition: A | Discharge status: Home / Self Care | Payer: Medicare Other | Source: Ambulatory Visit | Attending: Radiation Oncology | Admitting: Radiation Oncology

## 2018-02-04 DIAGNOSIS — C50919 Malignant neoplasm of unspecified site of unspecified female breast: Secondary | ICD-10-CM | POA: Diagnosis not present

## 2018-02-05 ENCOUNTER — Other Ambulatory Visit: Payer: Self-pay | Admitting: *Deleted

## 2018-02-05 ENCOUNTER — Ambulatory Visit
Admission: RE | Admit: 2018-02-05 | Discharge: 2018-02-05 | Disposition: A | Discharge status: Home / Self Care | Payer: Medicare Other | Source: Ambulatory Visit | Attending: Radiation Oncology | Admitting: Radiation Oncology

## 2018-02-05 DIAGNOSIS — Z170 Estrogen receptor positive status [ER+]: Principal | ICD-10-CM

## 2018-02-05 DIAGNOSIS — C50919 Malignant neoplasm of unspecified site of unspecified female breast: Secondary | ICD-10-CM | POA: Diagnosis not present

## 2018-02-05 DIAGNOSIS — C50211 Malignant neoplasm of upper-inner quadrant of right female breast: Secondary | ICD-10-CM

## 2018-02-06 ENCOUNTER — Ambulatory Visit: Payer: Medicare Other

## 2018-02-06 ENCOUNTER — Ambulatory Visit
Admission: RE | Admit: 2018-02-06 | Discharge: 2018-02-06 | Disposition: A | Discharge status: Home / Self Care | Payer: Medicare Other | Source: Ambulatory Visit | Attending: Radiation Oncology | Admitting: Radiation Oncology

## 2018-02-06 DIAGNOSIS — C50919 Malignant neoplasm of unspecified site of unspecified female breast: Secondary | ICD-10-CM | POA: Diagnosis not present

## 2018-02-09 ENCOUNTER — Ambulatory Visit
Admission: RE | Admit: 2018-02-09 | Discharge: 2018-02-09 | Disposition: A | Discharge status: Home / Self Care | Payer: Medicare Other | Source: Ambulatory Visit | Attending: Radiation Oncology | Admitting: Radiation Oncology

## 2018-02-09 DIAGNOSIS — C50919 Malignant neoplasm of unspecified site of unspecified female breast: Secondary | ICD-10-CM | POA: Diagnosis not present

## 2018-02-10 ENCOUNTER — Ambulatory Visit
Admission: RE | Admit: 2018-02-10 | Discharge: 2018-02-10 | Disposition: A | Discharge status: Home / Self Care | Payer: Medicare Other | Source: Ambulatory Visit | Attending: Radiation Oncology | Admitting: Radiation Oncology

## 2018-02-10 DIAGNOSIS — C50919 Malignant neoplasm of unspecified site of unspecified female breast: Secondary | ICD-10-CM | POA: Diagnosis not present

## 2018-02-11 ENCOUNTER — Ambulatory Visit
Admission: RE | Admit: 2018-02-11 | Discharge: 2018-02-11 | Disposition: A | Discharge status: Home / Self Care | Payer: Medicare Other | Source: Ambulatory Visit | Attending: Radiation Oncology | Admitting: Radiation Oncology

## 2018-02-11 DIAGNOSIS — C50919 Malignant neoplasm of unspecified site of unspecified female breast: Secondary | ICD-10-CM | POA: Diagnosis not present

## 2018-02-12 ENCOUNTER — Ambulatory Visit
Admission: RE | Admit: 2018-02-12 | Discharge: 2018-02-12 | Disposition: A | Discharge status: Home / Self Care | Payer: Medicare Other | Source: Ambulatory Visit | Attending: Radiation Oncology | Admitting: Radiation Oncology

## 2018-02-12 ENCOUNTER — Inpatient Hospital Stay: Payer: Medicare Other | Attending: Internal Medicine

## 2018-02-12 DIAGNOSIS — I10 Essential (primary) hypertension: Secondary | ICD-10-CM | POA: Diagnosis not present

## 2018-02-12 DIAGNOSIS — Z170 Estrogen receptor positive status [ER+]: Secondary | ICD-10-CM | POA: Diagnosis not present

## 2018-02-12 DIAGNOSIS — C50919 Malignant neoplasm of unspecified site of unspecified female breast: Secondary | ICD-10-CM | POA: Diagnosis not present

## 2018-02-12 DIAGNOSIS — C50211 Malignant neoplasm of upper-inner quadrant of right female breast: Secondary | ICD-10-CM | POA: Diagnosis not present

## 2018-02-12 LAB — CBC
HCT: 41.3 % (ref 36.0–46.0)
Hemoglobin: 12.5 g/dL (ref 12.0–15.0)
MCH: 27.8 pg (ref 26.0–34.0)
MCHC: 30.3 g/dL (ref 30.0–36.0)
MCV: 92 fL (ref 80.0–100.0)
PLATELETS: 221 10*3/uL (ref 150–400)
RBC: 4.49 MIL/uL (ref 3.87–5.11)
RDW: 13.3 % (ref 11.5–15.5)
WBC: 5.8 10*3/uL (ref 4.0–10.5)
nRBC: 0 % (ref 0.0–0.2)

## 2018-02-13 ENCOUNTER — Ambulatory Visit
Admission: RE | Admit: 2018-02-13 | Discharge: 2018-02-13 | Disposition: A | Discharge status: Home / Self Care | Payer: Medicare Other | Source: Ambulatory Visit | Attending: Radiation Oncology | Admitting: Radiation Oncology

## 2018-02-13 DIAGNOSIS — C50919 Malignant neoplasm of unspecified site of unspecified female breast: Secondary | ICD-10-CM | POA: Diagnosis not present

## 2018-02-16 ENCOUNTER — Ambulatory Visit
Admission: RE | Admit: 2018-02-16 | Discharge: 2018-02-16 | Disposition: A | Discharge status: Home / Self Care | Payer: Medicare Other | Source: Ambulatory Visit | Attending: Radiation Oncology | Admitting: Radiation Oncology

## 2018-02-16 DIAGNOSIS — C50919 Malignant neoplasm of unspecified site of unspecified female breast: Secondary | ICD-10-CM | POA: Diagnosis not present

## 2018-02-17 ENCOUNTER — Ambulatory Visit
Admission: RE | Admit: 2018-02-17 | Discharge: 2018-02-17 | Disposition: A | Discharge status: Home / Self Care | Payer: Medicare Other | Source: Ambulatory Visit | Attending: Radiation Oncology | Admitting: Radiation Oncology

## 2018-02-17 DIAGNOSIS — C50919 Malignant neoplasm of unspecified site of unspecified female breast: Secondary | ICD-10-CM | POA: Diagnosis not present

## 2018-02-18 ENCOUNTER — Ambulatory Visit
Admission: RE | Admit: 2018-02-18 | Discharge: 2018-02-18 | Disposition: A | Discharge status: Home / Self Care | Payer: Medicare Other | Source: Ambulatory Visit | Attending: Radiation Oncology | Admitting: Radiation Oncology

## 2018-02-18 DIAGNOSIS — C50919 Malignant neoplasm of unspecified site of unspecified female breast: Secondary | ICD-10-CM | POA: Diagnosis not present

## 2018-02-19 ENCOUNTER — Ambulatory Visit
Admission: RE | Admit: 2018-02-19 | Discharge: 2018-02-19 | Disposition: A | Discharge status: Home / Self Care | Payer: Medicare Other | Source: Ambulatory Visit | Attending: Radiation Oncology | Admitting: Radiation Oncology

## 2018-02-19 DIAGNOSIS — C50919 Malignant neoplasm of unspecified site of unspecified female breast: Secondary | ICD-10-CM | POA: Diagnosis not present

## 2018-02-20 ENCOUNTER — Ambulatory Visit
Admission: RE | Admit: 2018-02-20 | Discharge: 2018-02-20 | Disposition: A | Discharge status: Home / Self Care | Payer: Medicare Other | Source: Ambulatory Visit | Attending: Radiation Oncology | Admitting: Radiation Oncology

## 2018-02-20 DIAGNOSIS — C50919 Malignant neoplasm of unspecified site of unspecified female breast: Secondary | ICD-10-CM | POA: Diagnosis not present

## 2018-02-21 ENCOUNTER — Ambulatory Visit: Payer: Medicare Other

## 2018-02-23 ENCOUNTER — Ambulatory Visit
Admission: RE | Admit: 2018-02-23 | Discharge: 2018-02-23 | Disposition: A | Discharge status: Home / Self Care | Payer: Medicare Other | Source: Ambulatory Visit | Attending: Radiation Oncology | Admitting: Radiation Oncology

## 2018-02-23 DIAGNOSIS — C50919 Malignant neoplasm of unspecified site of unspecified female breast: Secondary | ICD-10-CM | POA: Diagnosis not present

## 2018-02-24 ENCOUNTER — Ambulatory Visit
Admission: RE | Admit: 2018-02-24 | Discharge: 2018-02-24 | Disposition: A | Discharge status: Home / Self Care | Payer: Medicare Other | Source: Ambulatory Visit | Attending: Radiation Oncology | Admitting: Radiation Oncology

## 2018-02-24 DIAGNOSIS — C50919 Malignant neoplasm of unspecified site of unspecified female breast: Secondary | ICD-10-CM | POA: Diagnosis not present

## 2018-02-25 ENCOUNTER — Ambulatory Visit
Admission: RE | Admit: 2018-02-25 | Discharge: 2018-02-25 | Disposition: A | Discharge status: Home / Self Care | Payer: Medicare Other | Source: Ambulatory Visit | Attending: Radiation Oncology | Admitting: Radiation Oncology

## 2018-02-25 DIAGNOSIS — C50919 Malignant neoplasm of unspecified site of unspecified female breast: Secondary | ICD-10-CM | POA: Diagnosis not present

## 2018-02-26 ENCOUNTER — Ambulatory Visit
Admission: RE | Admit: 2018-02-26 | Discharge: 2018-02-26 | Disposition: A | Discharge status: Home / Self Care | Payer: Medicare Other | Source: Ambulatory Visit | Attending: Radiation Oncology | Admitting: Radiation Oncology

## 2018-02-26 ENCOUNTER — Inpatient Hospital Stay: Payer: Medicare Other

## 2018-02-26 ENCOUNTER — Ambulatory Visit: Payer: Medicare Other

## 2018-02-26 DIAGNOSIS — C50919 Malignant neoplasm of unspecified site of unspecified female breast: Secondary | ICD-10-CM | POA: Diagnosis not present

## 2018-02-27 ENCOUNTER — Ambulatory Visit
Admission: RE | Admit: 2018-02-27 | Discharge: 2018-02-27 | Disposition: A | Discharge status: Home / Self Care | Payer: Medicare Other | Source: Ambulatory Visit | Attending: Radiation Oncology | Admitting: Radiation Oncology

## 2018-02-27 DIAGNOSIS — C50919 Malignant neoplasm of unspecified site of unspecified female breast: Secondary | ICD-10-CM | POA: Diagnosis not present

## 2018-03-02 ENCOUNTER — Ambulatory Visit
Admission: RE | Admit: 2018-03-02 | Discharge: 2018-03-02 | Disposition: A | Discharge status: Home / Self Care | Payer: Medicare Other | Source: Ambulatory Visit | Attending: Radiation Oncology | Admitting: Radiation Oncology

## 2018-03-02 DIAGNOSIS — C50919 Malignant neoplasm of unspecified site of unspecified female breast: Secondary | ICD-10-CM | POA: Diagnosis present

## 2018-03-03 ENCOUNTER — Ambulatory Visit
Admission: RE | Admit: 2018-03-03 | Discharge: 2018-03-03 | Disposition: A | Discharge status: Home / Self Care | Payer: Medicare Other | Source: Ambulatory Visit | Attending: Radiation Oncology | Admitting: Radiation Oncology

## 2018-03-03 DIAGNOSIS — C50919 Malignant neoplasm of unspecified site of unspecified female breast: Secondary | ICD-10-CM | POA: Diagnosis not present

## 2018-03-04 ENCOUNTER — Ambulatory Visit
Admission: RE | Admit: 2018-03-04 | Discharge: 2018-03-04 | Disposition: A | Discharge status: Home / Self Care | Payer: Medicare Other | Source: Ambulatory Visit | Attending: Radiation Oncology | Admitting: Radiation Oncology

## 2018-03-04 DIAGNOSIS — C50919 Malignant neoplasm of unspecified site of unspecified female breast: Secondary | ICD-10-CM | POA: Diagnosis not present

## 2018-03-05 ENCOUNTER — Ambulatory Visit
Admission: RE | Admit: 2018-03-05 | Discharge: 2018-03-05 | Disposition: A | Discharge status: Home / Self Care | Payer: Medicare Other | Source: Ambulatory Visit | Attending: Radiation Oncology | Admitting: Radiation Oncology

## 2018-03-05 DIAGNOSIS — C50919 Malignant neoplasm of unspecified site of unspecified female breast: Secondary | ICD-10-CM | POA: Diagnosis not present

## 2018-03-06 ENCOUNTER — Ambulatory Visit
Admission: RE | Admit: 2018-03-06 | Discharge: 2018-03-06 | Disposition: A | Discharge status: Home / Self Care | Payer: Medicare Other | Source: Ambulatory Visit | Attending: Radiation Oncology | Admitting: Radiation Oncology

## 2018-03-06 DIAGNOSIS — C50919 Malignant neoplasm of unspecified site of unspecified female breast: Secondary | ICD-10-CM | POA: Diagnosis not present

## 2018-03-09 ENCOUNTER — Ambulatory Visit
Admission: RE | Admit: 2018-03-09 | Discharge: 2018-03-09 | Disposition: A | Discharge status: Home / Self Care | Payer: Medicare Other | Source: Ambulatory Visit | Attending: Radiation Oncology | Admitting: Radiation Oncology

## 2018-03-09 DIAGNOSIS — C50919 Malignant neoplasm of unspecified site of unspecified female breast: Secondary | ICD-10-CM | POA: Diagnosis not present

## 2018-03-10 ENCOUNTER — Ambulatory Visit: Payer: Medicare Other

## 2018-03-11 ENCOUNTER — Ambulatory Visit: Payer: Medicare Other

## 2018-03-12 ENCOUNTER — Inpatient Hospital Stay: Payer: Medicare Other

## 2018-03-12 ENCOUNTER — Ambulatory Visit: Payer: Medicare Other

## 2018-03-13 ENCOUNTER — Ambulatory Visit: Payer: Medicare Other

## 2018-03-16 ENCOUNTER — Ambulatory Visit: Payer: Medicare Other

## 2018-03-17 ENCOUNTER — Ambulatory Visit: Payer: Medicare Other

## 2018-03-18 ENCOUNTER — Ambulatory Visit: Payer: Medicare Other

## 2018-03-19 ENCOUNTER — Ambulatory Visit: Payer: Medicare Other

## 2018-03-20 ENCOUNTER — Ambulatory Visit: Payer: Medicare Other

## 2018-04-07 ENCOUNTER — Telehealth: Payer: Self-pay | Admitting: *Deleted

## 2018-04-07 NOTE — Telephone Encounter (Signed)
Medical records faxed to [facility]; [place] [phone]

## 2018-04-09 ENCOUNTER — Other Ambulatory Visit: Payer: Self-pay

## 2018-04-09 ENCOUNTER — Ambulatory Visit
Admission: RE | Admit: 2018-04-09 | Discharge: 2018-04-09 | Disposition: A | Discharge status: Home / Self Care | Payer: Medicare Other | Source: Ambulatory Visit | Attending: Radiation Oncology | Admitting: Radiation Oncology

## 2018-04-09 VITALS — BP 206/80 | HR 70 | Temp 98.6°F | Resp 18 | Wt 215.1 lb

## 2018-04-09 DIAGNOSIS — Z170 Estrogen receptor positive status [ER+]: Secondary | ICD-10-CM | POA: Diagnosis not present

## 2018-04-09 DIAGNOSIS — C50211 Malignant neoplasm of upper-inner quadrant of right female breast: Secondary | ICD-10-CM | POA: Insufficient documentation

## 2018-04-09 DIAGNOSIS — Z923 Personal history of irradiation: Secondary | ICD-10-CM | POA: Diagnosis not present

## 2018-04-09 NOTE — Progress Notes (Signed)
Radiation Oncology Follow up Note  Name: Brenda Cain   Date:   04/09/2018 MRN:  [id number] DOB: [birth year]    This 85 y.o. female presents to the clinic today for one-month follow-up status postwhole breast radiation to her right breast for a pleomorphic lobular carcinoma ER/PR positive status post wide local excision.  REFERRING PROVIDER: Valera Castle, *  HPI: patient is a 85 year old female previously treated to her left chest wall peripheral hepatic for stage IIIa invasive mammary carcinoma. She recently completed radiation therapy to her right breast for a pleomorphic lobular carcinoma ER/PR positive status post wide local excision seen today in routine follow-up she is doing well she does have some hyperpigmentation of the skin she specifically denies breast tenderness cough or bone pain..she is not yet see medical oncology that point is planned to discuss further antiestrogen therapy.  COMPLICATIONS OF TREATMENT: none  FOLLOW UP COMPLIANCE: keeps appointments   PHYSICAL EXAM:  BP (!) 206/80   Pulse 70   Temp 98.6 F (37 C)   Resp 18   Wt 215 lb 0.9 oz (97.6 kg)   BMI 34.71 kg/m  She status post left modified radical mastectomy chest wall is clear. Right breast has hyperpigmentation and some dry desquamation of skin. No dominant mass or nodularity is noted in the right breast. No axillary or supraclavicular adenopathy is noted bilaterally. No evidence of lymphedema in her right upper extremity is noted.Well-developed well-nourished patient in NAD. HEENT reveals PERLA, EOMI, discs not visualized.  Oral cavity is clear. No oral mucosal lesions are identified. Neck is clear without evidence of cervical or supraclavicular adenopathy. Lungs are clear to A&P. Cardiac examination is essentially unremarkable with regular rate and rhythm without murmur rub or thrill. Abdomen is benign with no organomegaly or masses noted. Motor sensory and DTR levels are equal and symmetric in the  upper and lower extremities. Cranial nerves II through XII are grossly intact. Proprioception is intact. No peripheral adenopathy or edema is identified. No motor or sensory levels are noted. Crude visual fields are within normal range.  RADIOLOGY RESULTS: no current films for review  PLAN: present time patient is doing well 1 month out from whole breast radiation to her right breast. We will arrange for her to see medical oncology to discuss antiestrogen therapy. I'm otherwise please were overall progress. I've asked to see her back in 4-5 months for follow-up. Patient is to call sooner with any concerns.  I would like to take this opportunity to thank you for allowing me to participate in the care of your patient.[name], MD

## 2018-10-21 ENCOUNTER — Ambulatory Visit: Payer: Medicare Other | Admitting: Radiation Oncology

## 2018-11-24 IMAGING — MG MM DIGITAL DIAGNOSTIC UNILAT*R* W/ TOMO W/ CAD
4 series · 6 of 12 positions shown · non-contrast
Comparison: Previous exam(s).

CLINICAL DATA: 84-year-old female with history of left breast
mastectomy for malignant phyllodes. She was initially recalled from
her screening mammogram in Wednesday April, 2015 for a right breast
asymmetry. She did not have her diagnostic follow-up until the
following year in Saturday May, 2016 where an irregular highly suspicious
mass was identified in the superior right breast at [DATE]. Biopsy
was recommended at that time, however the patient has been lost to
follow-up until now.

EXAM:
DIGITAL DIAGNOSTIC UNILATERAL RIGHT MAMMOGRAM WITH CAD AND TOMO
RIGHT BREAST ULTRASOUND

[R MLO synth-2D]
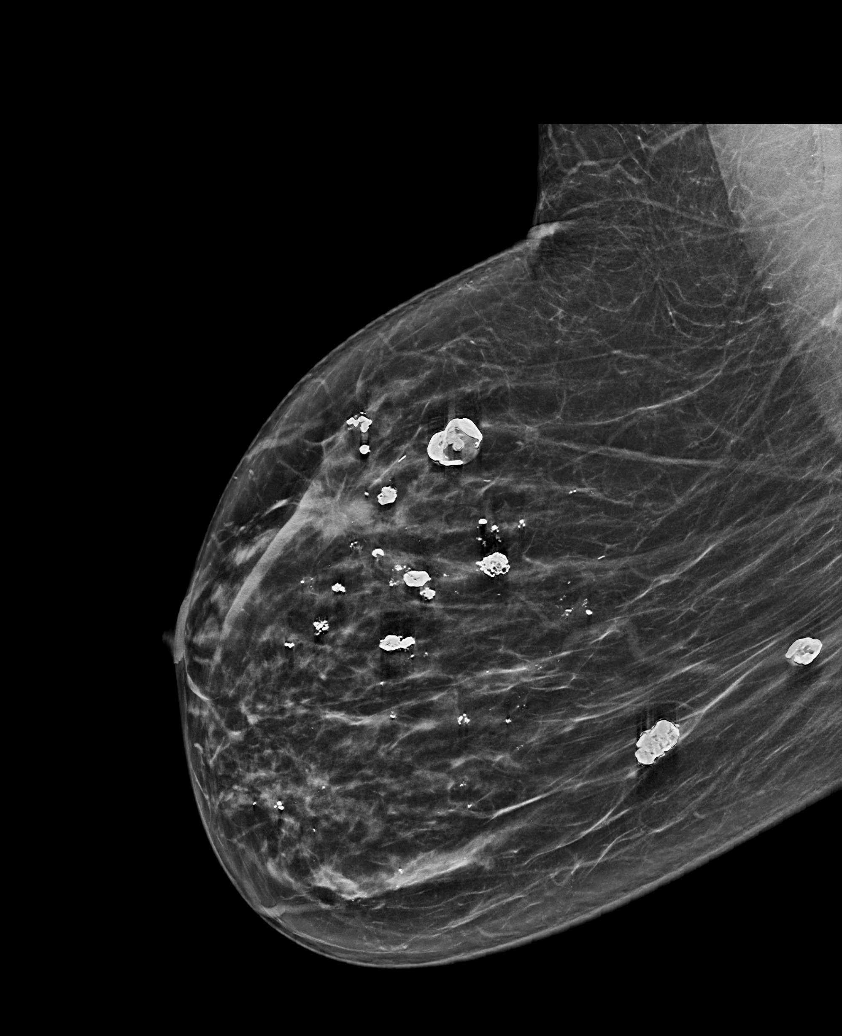

[R CC synth-2D]
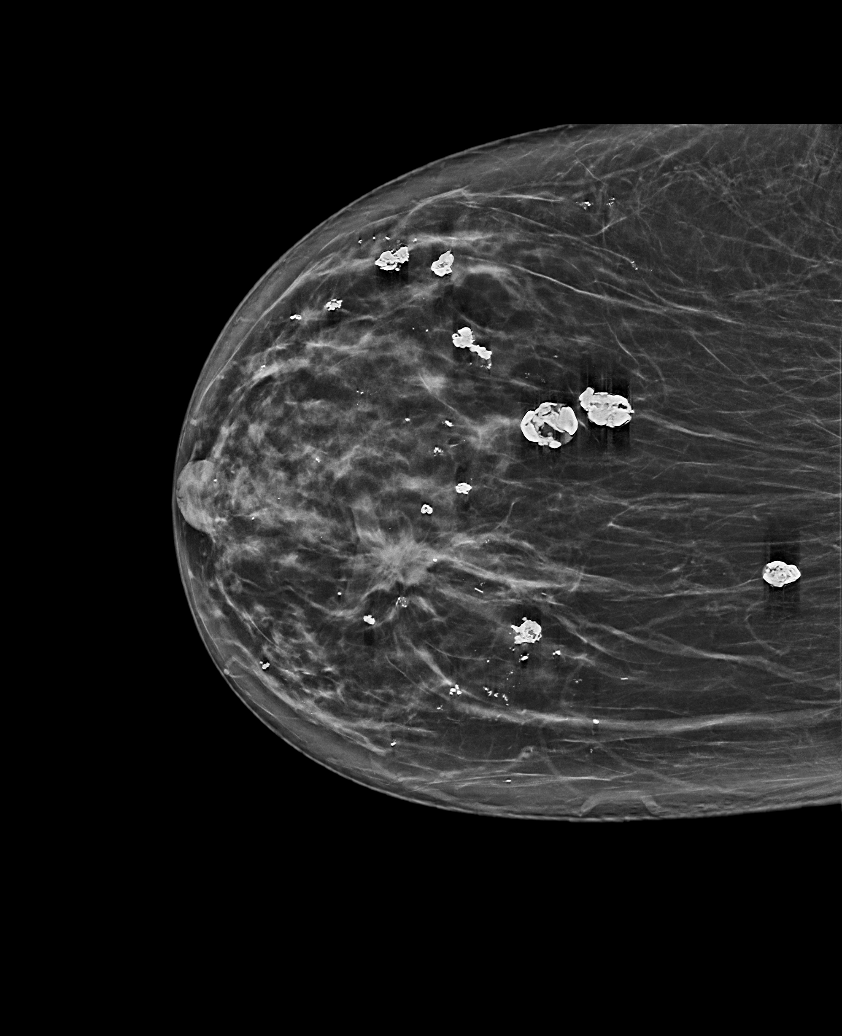

[R CC tomo · 3 of 74 frames shown]
[frame 24/74]
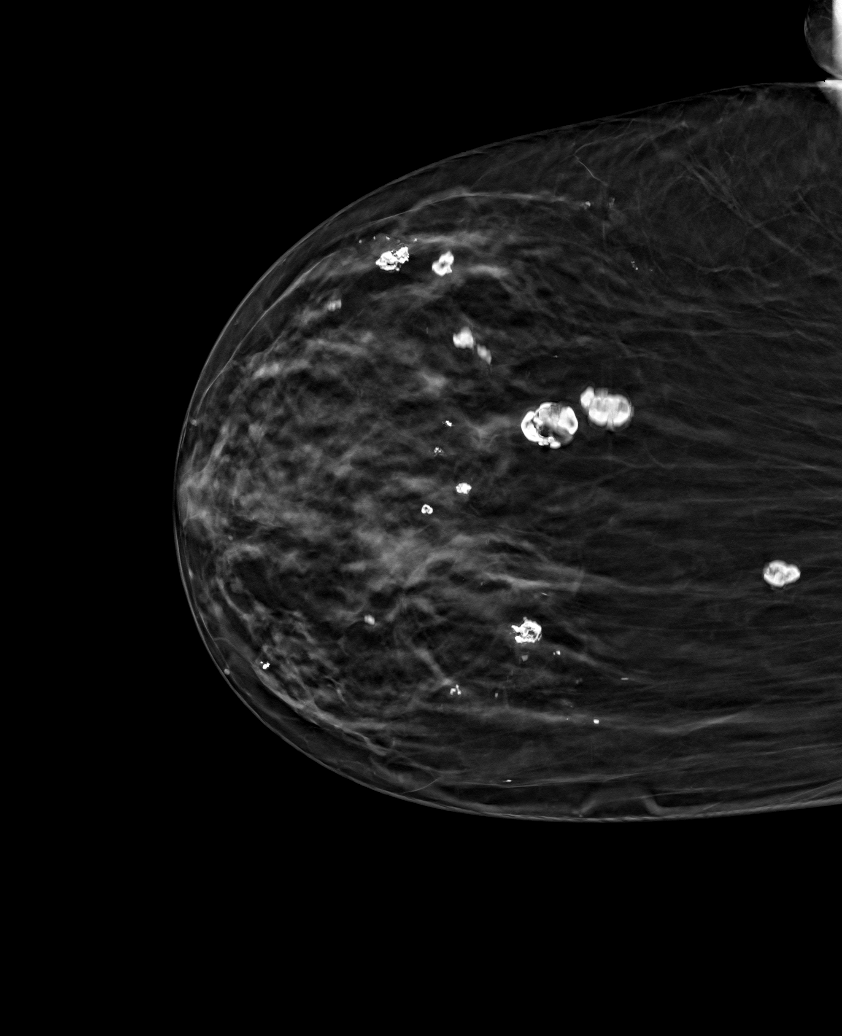
[frame 37/74]
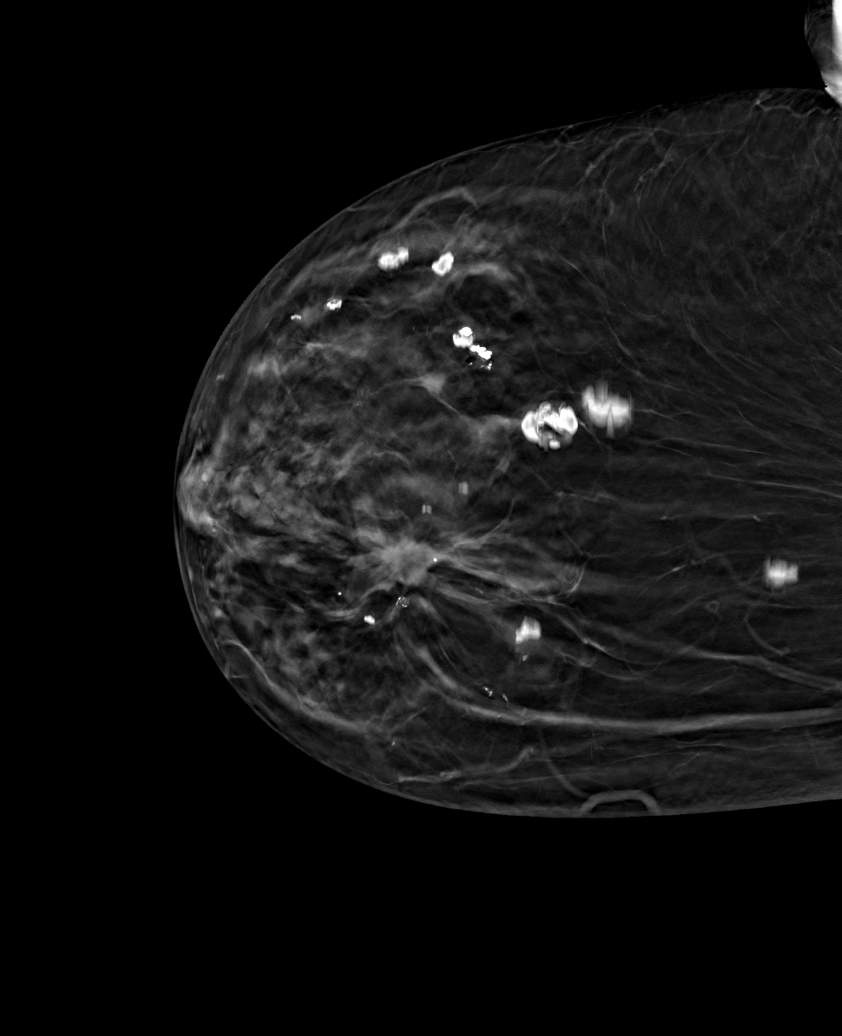
[frame 51/74]
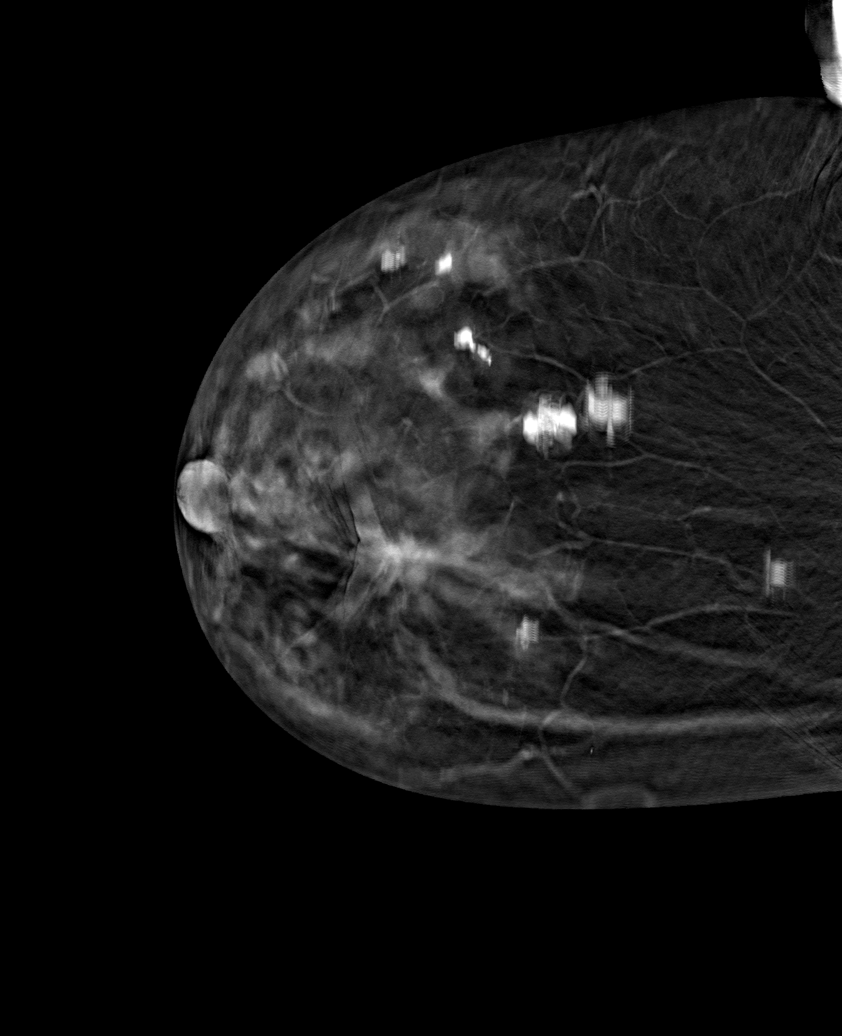

[R MLO tomo · tomo slice 35/69.0]
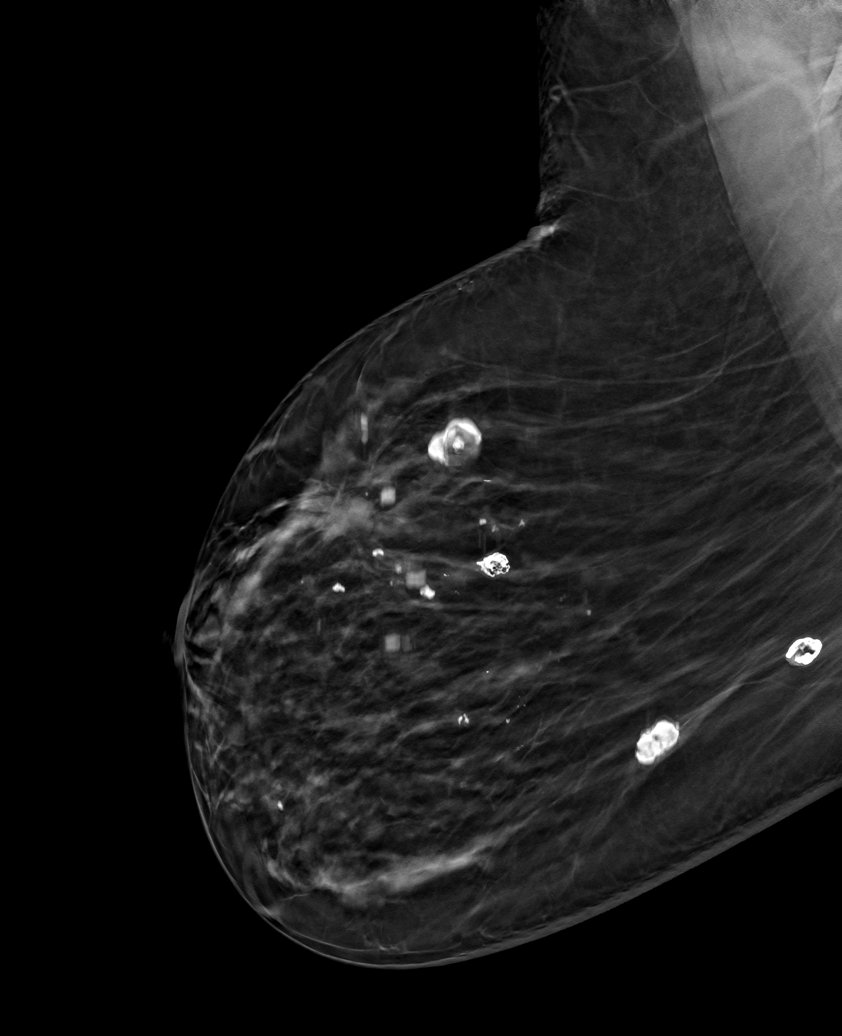

[6 of 12 positions shown; findings below may reference images not displayed]

ACR Breast Density Category c: The breast tissue is heterogeneously
dense, which may obscure small masses.
FINDINGS: The irregular spiculated mass in the upper inner quadrant of the
left breast has increased in size and become more dense. There is
evidence of puckering and tethering of the overlying skin. No other
suspicious calcifications, masses or areas of distortion are seen in
the right breast.

Mammographic images were processed with CAD.

Ultrasound of the right breast at [DATE], 2 cm from the nipple
demonstrates that the mass previously identified has increased in
size and encroached on the skin. There is suspicion of possible skin
involvement. Today, this mass measures 1.7 x 0.8 x 0.9 cm,
previously measuring 1.0 x 0.8 x 0.8 cm (remeasured in similar
plane).
IMPRESSION: 1. There is a highly suspicious enlarging mass in the right breast
at [DATE], possibly now involving skin.

2.  No evidence of right axillary lymphadenopathy.

RECOMMENDATION:
Ultrasound guided biopsy is recommended for the right breast mass at
[DATE]. The patient is extremely resistant to discussion regarding
biopsy or management of the right breast mass. She has had a very
bad experience previously and therefore does not [facility] doctors. I
explained that I am very concerned about this mass and strongly
encouraged her to consider the biopsy, as that is the best way to
understand her options for treatment.

I have discussed the findings and recommendations with the patient.
Results were also provided in writing at the conclusion of the
visit. If applicable, a reminder letter will be sent to the patient
regarding the next appointment.

BI-RADS CATEGORY  5: Highly suggestive of malignancy.

## 2018-11-25 ENCOUNTER — Ambulatory Visit: Payer: Medicare Other | Admitting: Radiation Oncology

## 2019-01-05 IMAGING — MG US  BREAST BX W/ LOC DEV 1ST LESION IMG BX SPEC US GUIDE*R*
1 series · 8 of 8 positions shown · non-contrast
Comparison: Previous exam(s).

ADDENDUM:
Pathology of the right breast biopsy revealed A. BREAST, RIGHT,
12:30 O'CLOCK 2 CM FROM NIPPLE; ULTRASOUND-GUIDED CORE BIOPSY:
INVASIVE LOBULAR CARCINOMA. Size of invasive carcinoma: 12 mm in
this sample. Histologic grade of invasive carcinoma: Grade 2. Ductal
carcinoma in situ: Not identified. Lymphovascular invasion: Not
identified. ER/PR/HER2: Immunohistochemistry will be performed on
block A1, with reflex to FISH for HER2 2+. The results will be
reported in an addendum. Comment: The definitive grade will be
assigned on the excisional specimen. These findings were
communicated to Kisi U Tibjid Gannon Said in Dr. [REDACTED] on 09/17/2017
at [DATE]. Read back procedure was performed.

This was found to be concordant by Dr. Balki.
Recommendation: Surgical referral and follow-up with oncologist.
Results were relayed to the Kagi, RN, nurse navigator for
[HOSPITAL]. She asked that results be
discussed with the patient at her appointment with Dr. Jevan Tiger on
09/23/17. Per notes in [REDACTED], a surgical appointment was made for the
patient to see Dr. Yoseph on 09/22/17 at [DATE] to discuss results of
the biopsy.
Addendum by Ichim on 09/19/17.
CLINICAL DATA: Patient presents for ultrasound-guided core needle
biopsy of a 1.7 cm suspicious mass over the [DATE] position of the
right breast 2 cm from the nipple.
EXAM:
ULTRASOUND GUIDED RIGHT BREAST CORE NEEDLE BIOPSY

[Series 1: MG view · 0.06mm/px · 8 of 11 slices shown]
[im 1/11]
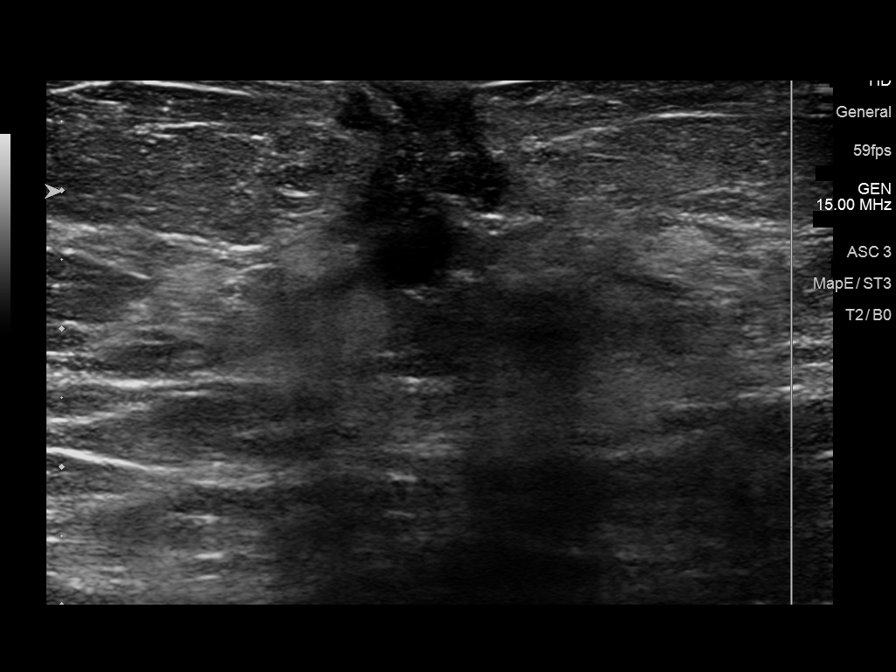
[im 2/11]
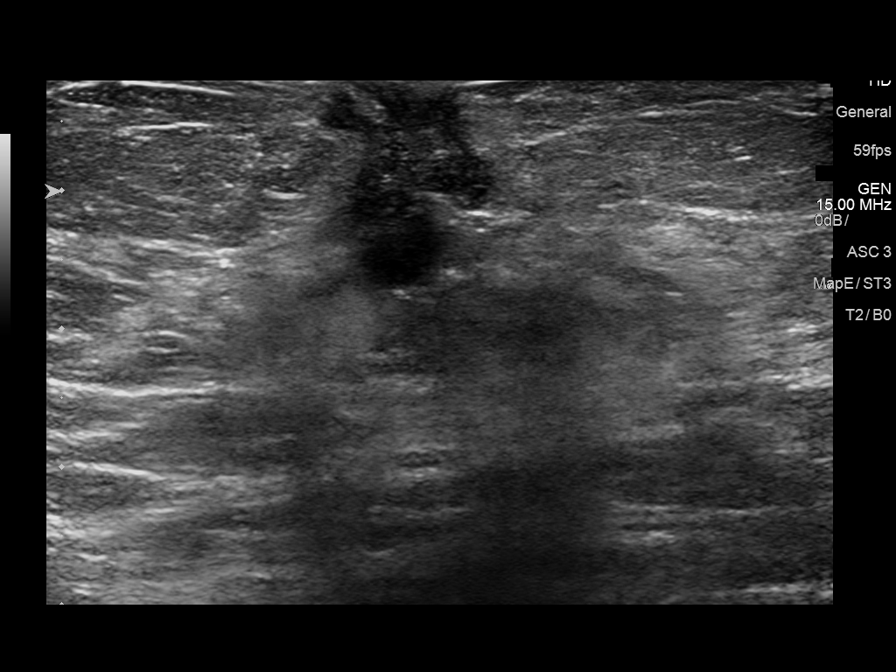
[im 3/11]
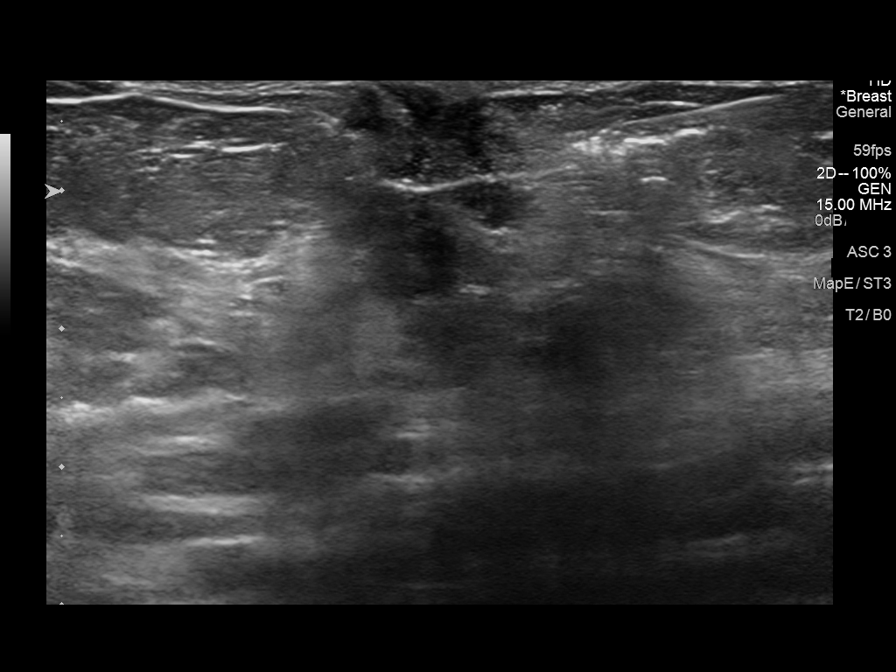
[im 5/11]
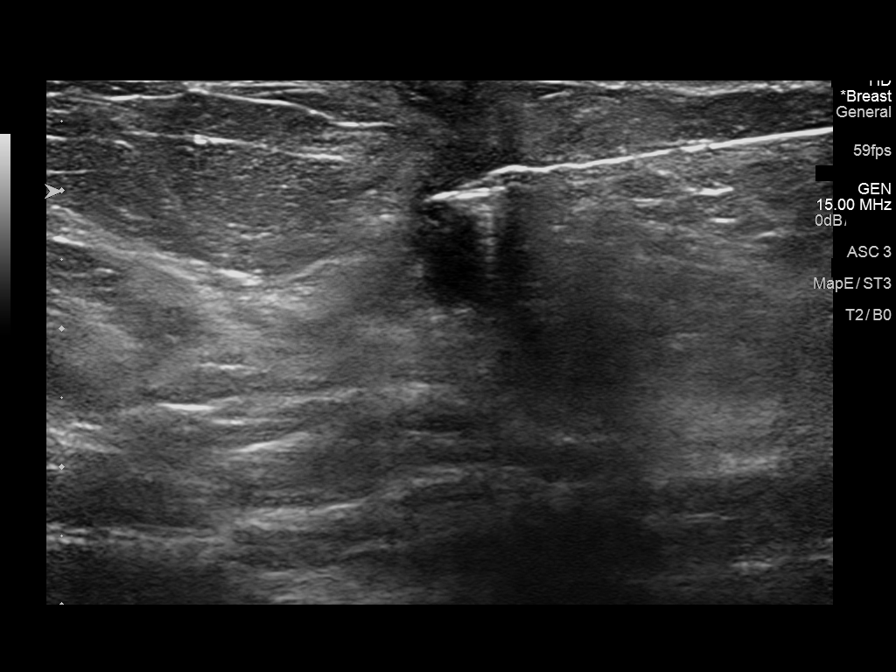
[im 6/11]
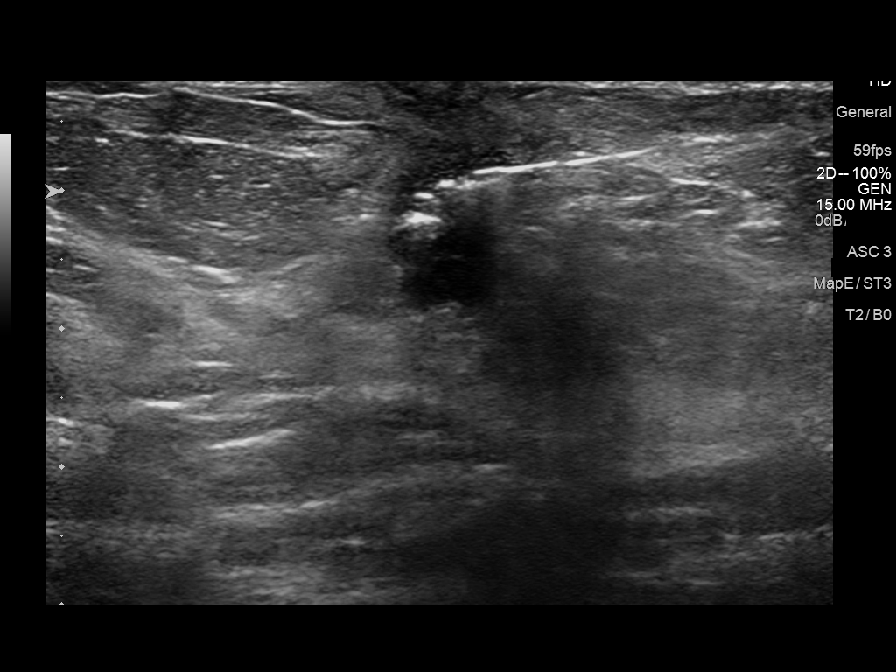
[im 8/11]
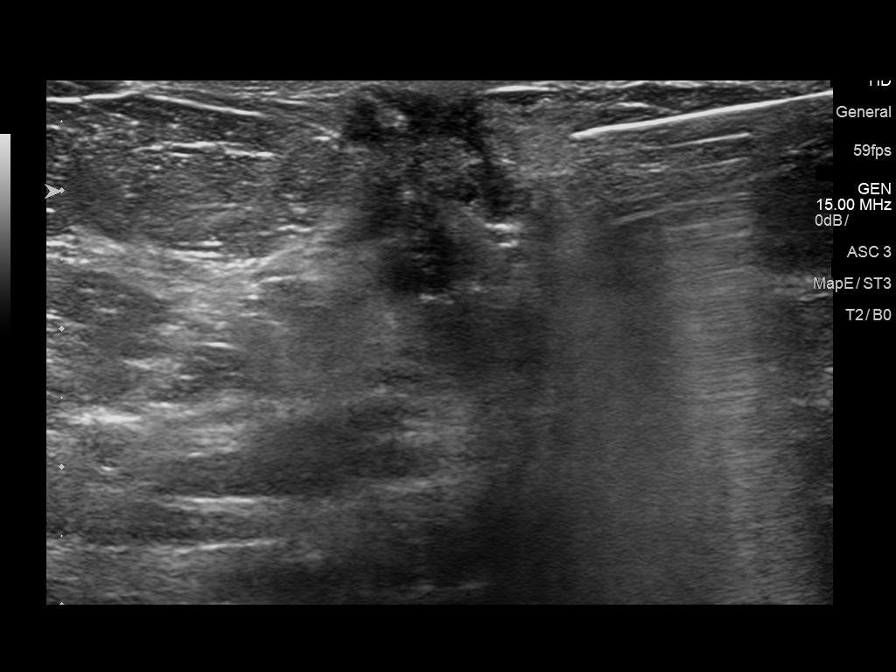
[im 9/11]
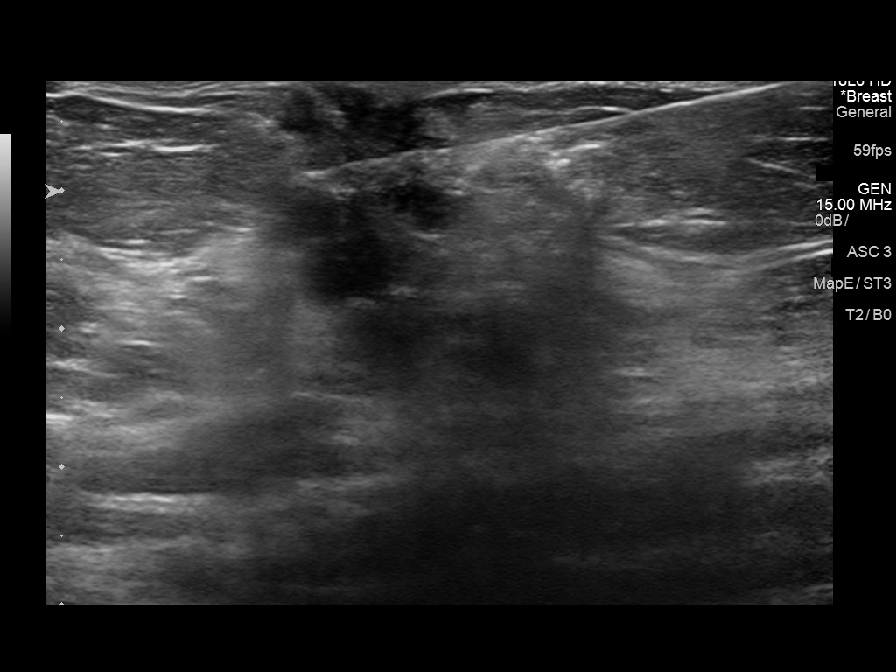
[im 11/11]
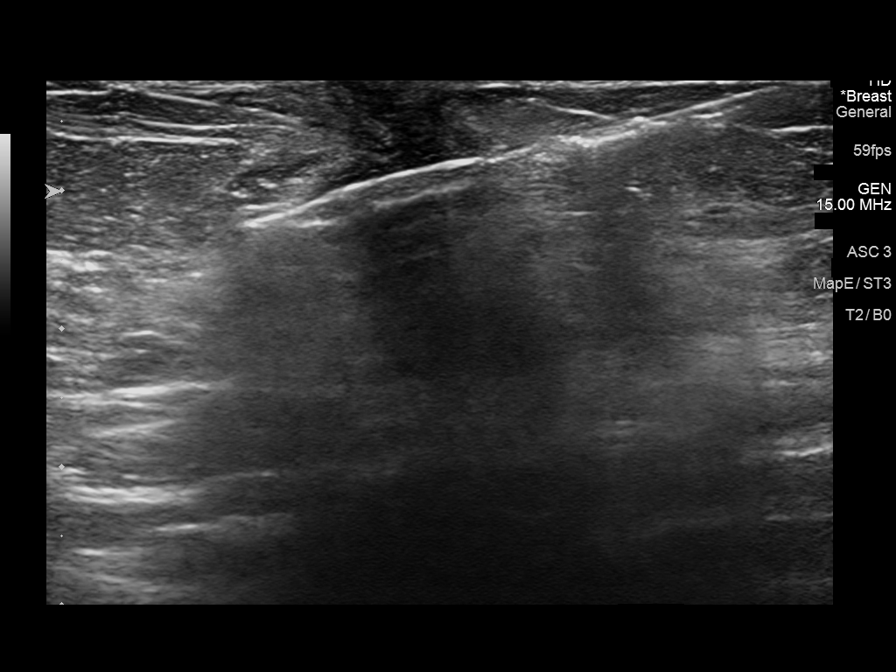

[8 of 8 positions shown; findings below may reference images not displayed]



Lesion quadrant: Right inner upper quadrant.

Using sterile technique and 1% Lidocaine as local anesthetic, under
direct ultrasound visualization, a 12 gauge Brack Tiger, Labelle Salha device was
used to perform biopsy of the targeted mass at the [DATE] position
using a medial to lateral approach. Two adequate tissue specimens
were obtained. At the conclusion of the procedure a venous shaped
tissue marker clip was deployed into the biopsy cavity. Follow up 2
view mammogram was performed and dictated separately.
IMPRESSION: Ultrasound guided biopsy of a suspicious right breast mass. No
apparent complications.

## 2019-02-08 ENCOUNTER — Encounter: Payer: Self-pay | Admitting: Radiation Oncology

## 2019-02-08 ENCOUNTER — Other Ambulatory Visit: Payer: Self-pay

## 2019-02-08 ENCOUNTER — Ambulatory Visit
Admission: RE | Admit: 2019-02-08 | Discharge: 2019-02-08 | Disposition: A | Discharge status: Home / Self Care | Payer: Medicare Other | Source: Ambulatory Visit | Attending: Radiation Oncology | Admitting: Radiation Oncology

## 2019-02-08 VITALS — BP 201/87 | HR 78 | Temp 98.8°F

## 2019-02-08 DIAGNOSIS — Z9011 Acquired absence of right breast and nipple: Secondary | ICD-10-CM | POA: Diagnosis not present

## 2019-02-08 DIAGNOSIS — Z853 Personal history of malignant neoplasm of breast: Secondary | ICD-10-CM | POA: Insufficient documentation

## 2019-02-08 DIAGNOSIS — C50211 Malignant neoplasm of upper-inner quadrant of right female breast: Secondary | ICD-10-CM

## 2019-02-08 NOTE — Progress Notes (Signed)
Radiation Oncology Follow up Note  Name: Brenda Cain   Date:   02/08/2019 MRN:  [id number] DOB: 1933/11/04    This 86 y.o. female presents to the clinic today for 88-month follow-up status post whole breast radiation to her right breast for pleomorphic lobular carcinoma ER/PR positive status post wide local excision.  REFERRING PROVIDER: Valera Castle, *  HPI: Patient is an 86 year old female now at 11 months having received radiation therapy to her right breast for a pleomorphic lobular carcinoma ER/PR positive.  She also previously received left chest wall peripheral emphatic radiation for a stage IIIa invasive mammary carcinoma status post mastectomy seen today in routine follow-up she is doing well she specifically denies any chest wall tenderness any right breast tenderness cough or bone pain..  Patient refuses any further mammograms.  She also has declined antiestrogen therapy.  COMPLICATIONS OF TREATMENT: none  FOLLOW UP COMPLIANCE: keeps appointments   PHYSICAL EXAM:  BP (!) 201/87   Pulse 78   Temp 98.8 F (37.1 C)  Patient status post left modified radical mastectomy chest wall scar site is clear without evidence of nodularity or mass.  Right breast shows no evidence of dominant mass or nodularity.  No axillary or supraclavicular adenopathy is identified.  Well-developed well-nourished patient in NAD. HEENT reveals PERLA, EOMI, discs not visualized.  Oral cavity is clear. No oral mucosal lesions are identified. Neck is clear without evidence of cervical or supraclavicular adenopathy. Lungs are clear to A&P. Cardiac examination is essentially unremarkable with regular rate and rhythm without murmur rub or thrill. Abdomen is benign with no organomegaly or masses noted. Motor sensory and DTR levels are equal and symmetric in the upper and lower extremities. Cranial nerves II through XII are grossly intact. Proprioception is intact. No peripheral adenopathy or edema is  identified. No motor or sensory levels are noted. Crude visual fields are within normal range.  RADIOLOGY RESULTS: No current films for review  PLAN: Present time patient is doing well by clinical exam there is no evidence of disease I.  I have asked to see her back in 1 year for follow-up.  I am try to make arrangements for her to be followed by medical oncology as she has seen Dr. Oneita Jolly in the past.  Patient knows to call with any concerns.  I would like to take this opportunity to thank you for allowing me to participate in the care of your patient.[name], MD

## 2019-04-09 ENCOUNTER — Ambulatory Visit
Admission: EM | Admit: 2019-04-09 | Discharge: 2019-04-09 | Disposition: A | Discharge status: Home / Self Care | Payer: Medicare Other | Attending: Family Medicine | Admitting: Family Medicine

## 2019-04-09 ENCOUNTER — Other Ambulatory Visit: Payer: Self-pay

## 2019-04-09 DIAGNOSIS — H6991 Unspecified Eustachian tube disorder, right ear: Secondary | ICD-10-CM | POA: Diagnosis not present

## 2019-04-09 NOTE — Discharge Instructions (Signed)
Over the counter antihistamine °

## 2019-04-09 NOTE — ED Triage Notes (Signed)
Patient complains of right ear pain. States that she started to noticing a buzzing/ringing noise and pain that shoots into her head.

## 2019-04-10 NOTE — ED Provider Notes (Signed)
[facility]    CSN: [id number] Arrival date & time: 04/09/19  1126      History   Chief Complaint Chief Complaint  Patient presents with  . Otalgia  . Tinnitus    HPI Brenda Cain is a 86 y.o. female.   86 yo female with a c/o right ear pain and pressure sensation, intermittently, for the past 2-3 days. States also sometimes associated with ringing or buzzing. States last night she put some hydrogen peroxide drops in her ear canal. Denies any fevers, chills, drainage. States she does have some mild allergies.    Otalgia   Past Medical History:  Diagnosis Date  . Breast cancer ([place]) 03/27/10   lt mastectomy/radiation  . History of radiation therapy   . Hypertension   . Hypothyroidism   . Pre-diabetes   . Umbilical hernia     Patient Active Problem List   Diagnosis Date Noted  . Carcinoma of upper-inner quadrant of right breast in female, estrogen receptor positive ([place]) 09/23/2017  . Malignant phyllodes tumor of breast, left ([place]) 05/14/2016  . H/O malignant neoplasm of breast 05/15/2015  . BP (high blood pressure) 05/15/2015  . Adult hypothyroidism 05/15/2015  . Umbilical hernia without obstruction and without gangrene 04/08/2014  . Hernia of anterior abdominal wall 03/23/2014  . Edema, peripheral 01/18/2013    Past Surgical History:  Procedure Laterality Date  . BREAST BIOPSY Right 09/16/2017   breast cancer  . BREAST EXCISIONAL BIOPSY Right 11/05/2017   lumpectomy   . BREAST LUMPECTOMY WITH SENTINEL LYMPH NODE BIOPSY Right 11/05/2017   Procedure: BREAST LUMPECTOMY WITH SENTINEL LYMPH NODE BX;  Surgeon: Jules Husbands, MD;  Location: [facility] ORS;  Service: General;  Laterality: Right;  . HERNIA REPAIR    . MASTECTOMY Left 06/19/2010   had radiation    OB History   No obstetric history on file.      Home Medications    Prior to Admission medications   Medication Sig Start Date End Date Taking? Authorizing Provider  aspirin EC 81 MG  tablet Take 1 tablet by mouth daily.   Yes [provider]  CALCIUM-MAGNESIUM-ZINC PO Take by mouth daily.   Yes [provider]  levothyroxine (SYNTHROID, LEVOTHROID) 50 MCG tablet Take 1 tablet by mouth daily.   Yes [provider]  lisinopril (PRINIVIL,ZESTRIL) 10 MG tablet Take 10 mg by mouth.   Yes [provider]  torsemide (DEMADEX) 5 MG tablet Take 5 mg by mouth daily.   Yes [provider]  Vitamin D, Cholecalciferol, 400 units TABS Take by mouth daily.   Yes [provider]  HYDROcodone-acetaminophen (NORCO/VICODIN) 5-325 MG tablet Take 1-2 tablets by mouth every 6 (six) hours as needed for moderate pain. Patient not taking: Reported on 11/24/2017 11/05/17   Jules Husbands, MD    Family History Family History  Problem Relation Age of Onset  . Hypertension Father   . Hypertension Sister   . Diabetes Sister   . Breast cancer Neg Hx     Social History Social History   Tobacco Use  . Smoking status: Never Smoker  . Smokeless tobacco: Never Used  Substance Use Topics  . Alcohol use: No    Alcohol/week: 0.0 standard drinks  . Drug use: No     Allergies   Aspartame, Nickel, Penicillins, Orange oil, Red dye, Sheep allergy, and Yellow dye   Review of Systems Review of Systems  HENT: Positive for ear pain.  Physical Exam Triage Vital Signs ED Triage Vitals  Enc Vitals Group     BP 04/09/19 1218 (!) 181/88     Pulse Rate 04/09/19 1218 78     Resp 04/09/19 1218 18     Temp 04/09/19 1218 98.5 F (36.9 C)     Temp Source 04/09/19 1218 Oral     SpO2 04/09/19 1218 99 %     Weight 04/09/19 1215 218 lb (98.9 kg)     Height 04/09/19 1215 5\' 6"  (1.676 m)     Head Circumference --      Peak Flow --      Pain Score 04/09/19 1215 4     Pain Loc --      Pain Edu? --      Excl. in [place]? --    No data found.  Updated Vital Signs BP (!) 181/88 (BP Location: Right Arm)   Pulse 78   Temp 98.5 F (36.9 C) (Oral)    Resp 18   Ht 5\' 6"  (1.676 m)   Wt 98.9 kg   SpO2 99%   BMI 35.19 kg/m   Visual Acuity Right Eye Distance:   Left Eye Distance:   Bilateral Distance:    Right Eye Near:   Left Eye Near:    Bilateral Near:     Physical Exam Vitals and nursing note reviewed.  Constitutional:      General: She is not in acute distress.    Appearance: She is not toxic-appearing or diaphoretic.  HENT:     Right Ear: Tympanic membrane, ear canal and external ear normal. There is no impacted cerumen.     Left Ear: Tympanic membrane, ear canal and external ear normal. There is no impacted cerumen.  Neurological:     Mental Status: She is alert.      UC Treatments / Results  Labs (all labs ordered are listed, but only abnormal results are displayed) Labs Reviewed - No data to display  EKG   Radiology No results found.  Procedures Procedures (including critical care time)  Medications Ordered in UC Medications - No data to display  Initial Impression / Assessment and Plan / UC Course  I have reviewed the triage vital signs and the nursing notes.  Pertinent labs & imaging results that were available during my care of the patient were reviewed by me and considered in my medical decision making (see chart for details).      Final Clinical Impressions(s) / UC Diagnoses   Final diagnoses:  Eustachian tube disorder, right     Discharge Instructions     Over the counter antihistamine    ED Prescriptions    None      1. diagnosis reviewed with patient 2. Recommend supportive treatment as above  3. Follow-up prn if symptoms worsen or don't improve  PDMP not reviewed this encounter.   Norval Gable, MD 04/10/19 1212

## 2020-02-14 ENCOUNTER — Ambulatory Visit: Payer: Medicare Other | Admitting: Radiation Oncology

## 2020-02-14 ENCOUNTER — Telehealth: Payer: Self-pay | Admitting: Radiation Oncology

## 2020-02-14 NOTE — Telephone Encounter (Signed)
Pt. Called and would like to r/s her previously scheduled appointment for 1/17.

## 2020-03-06 ENCOUNTER — Ambulatory Visit
Admission: RE | Admit: 2020-03-06 | Discharge: 2020-03-06 | Disposition: A | Discharge status: Home / Self Care | Payer: Medicare Other | Source: Ambulatory Visit | Attending: Radiation Oncology | Admitting: Radiation Oncology

## 2020-03-06 ENCOUNTER — Encounter: Payer: Self-pay | Admitting: Radiation Oncology

## 2020-03-06 VITALS — BP 226/85 | HR 64 | Temp 97.0°F | Wt 210.0 lb

## 2020-03-06 DIAGNOSIS — Z923 Personal history of irradiation: Secondary | ICD-10-CM | POA: Diagnosis not present

## 2020-03-06 DIAGNOSIS — Z853 Personal history of malignant neoplasm of breast: Secondary | ICD-10-CM | POA: Insufficient documentation

## 2020-03-06 DIAGNOSIS — Z170 Estrogen receptor positive status [ER+]: Secondary | ICD-10-CM

## 2020-03-06 DIAGNOSIS — C50211 Malignant neoplasm of upper-inner quadrant of right female breast: Secondary | ICD-10-CM

## 2020-03-06 NOTE — Progress Notes (Signed)
Radiation Oncology Follow up Note  Name: Brenda Cain   Date:   03/06/2020 MRN:  [id number] DOB: Jul 06, 1933    This 87 y.o. 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  REFERRING PROVIDER: Valera Castle, *  HPI: Patient is an 87 year old female now out 2 years having completed whole breast radiation to her right breast for pleomorphic lobular carcinoma ER/PR positive.  She also previously received left chest wall and peripheral lymphatic radiation for stage IIIa invasive mammary carcinoma status postmastectomy seen today in routine follow-up she is doing well.  She specifically denies any chest wall pain or nodularity or any right breast pain or tenderness..  She has not had any further mammograms of her right breast and patient declines any future mammograms.  Patient is also not on antiestrogen therapy.  COMPLICATIONS OF TREATMENT: none  FOLLOW UP COMPLIANCE: keeps appointments   PHYSICAL EXAM:  BP (!) 226/85   Pulse 64   Temp (!) 97 F (36.1 C) (Tympanic)   Wt 210 lb (95.3 kg)   BMI 33.89 kg/m  She status post left modified radical mastectomy chest wall is clear without evidence of nodular mass.  Right breast is free of dominant mass or nodularity.  Well-developed well-nourished patient in NAD. HEENT reveals PERLA, EOMI, discs not visualized.  Oral cavity is clear. No oral mucosal lesions are identified. Neck is clear without evidence of cervical or supraclavicular adenopathy. Lungs are clear to A&P. Cardiac examination is essentially unremarkable with regular rate and rhythm without murmur rub or thrill. Abdomen is benign with no organomegaly or masses noted. Motor sensory and DTR levels are equal and symmetric in the upper and lower extremities. Cranial nerves II through XII are grossly intact. Proprioception is intact. No  peripheral adenopathy or edema is identified. No motor or sensory levels are noted. Crude visual fields are within normal range.  RADIOLOGY RESULTS: No current films for review  PLAN: Present time patient continues to do well with no evidence of disease at 87 years old.  Again she does not wish to have any further mammograms.  I have asked to see her back in 1 year for follow-up.  Patient knows to call with any concerns.  I would like to take this opportunity to thank you for allowing me to participate in the care of your patient.[name], MD

## 2021-03-07 ENCOUNTER — Encounter: Payer: Self-pay | Admitting: Radiation Oncology

## 2021-03-07 ENCOUNTER — Other Ambulatory Visit: Payer: Self-pay

## 2021-03-07 ENCOUNTER — Ambulatory Visit
Admission: RE | Admit: 2021-03-07 | Discharge: 2021-03-07 | Disposition: A | Discharge status: Home / Self Care | Payer: Medicare Other | Source: Ambulatory Visit | Attending: Radiation Oncology | Admitting: Radiation Oncology

## 2021-03-07 VITALS — BP 185/75 | HR 66 | Temp 98.2°F | Resp 20 | Wt 200.8 lb

## 2021-03-07 DIAGNOSIS — Z923 Personal history of irradiation: Secondary | ICD-10-CM | POA: Diagnosis not present

## 2021-03-07 DIAGNOSIS — C50211 Malignant neoplasm of upper-inner quadrant of right female breast: Secondary | ICD-10-CM

## 2021-03-07 DIAGNOSIS — Z853 Personal history of malignant neoplasm of breast: Secondary | ICD-10-CM | POA: Insufficient documentation

## 2021-03-07 DIAGNOSIS — Z170 Estrogen receptor positive status [ER+]: Secondary | ICD-10-CM

## 2021-03-07 NOTE — Progress Notes (Signed)
Radiation Oncology Follow up Note  Name: Brenda Cain   Date:   03/07/2021 MRN:  [id number] DOB: [birth year]    This 88 y.o. female presents to the clinic today for close to 3 and half year follow-up status post whole breast radiation to her right breast with pleomorphic lobular carcinoma ER/PR positive.  REFERRING PROVIDER: Valera Castle, *  HPI: Patient is an 88 year old female now out over 3 years having completed whole breast radiation to right breast with pleomorphic lobular carcinoma ER/PR positive.  She is also had previously received left chest wall and peripheral lymphatic radiation for stage IIIa invasive mammary carcinoma status postmastectomy seen today in routine follow-up she is doing well specifically denies any chest wall pain or discomfort any pain in her right breast..  She no longer gets mammograms.  She is not on antiestrogen therapy.  COMPLICATIONS OF TREATMENT: none  FOLLOW UP COMPLIANCE: keeps appointments   PHYSICAL EXAM:  BP (!) 185/75 (BP Location: Right Wrist, Patient Position: Sitting, Cuff Size: Small)    Pulse 66    Temp 98.2 F (36.8 C) (Tympanic)    Resp 20    Wt 200 lb 12.8 oz (91.1 kg)    BMI 32.41 kg/m  Patient is status post left modified radical mastectomy chest wall is clear.  Right breast is free of dominant mass no axillary or supraclavicular adenopathy is identified.  No lymphedema is noted in her upper extremities.  Well-developed well-nourished patient in NAD. HEENT reveals PERLA, EOMI, discs not visualized.  Oral cavity is clear. No oral mucosal lesions are identified. Neck is clear without evidence of cervical or supraclavicular adenopathy. Lungs are clear to A&P. Cardiac examination is essentially unremarkable with regular rate and rhythm without murmur rub or thrill. Abdomen is benign with no organomegaly or masses noted. Motor sensory and DTR levels are equal and symmetric in the upper and lower extremities. Cranial nerves II through XII are  grossly intact. Proprioception is intact. No peripheral adenopathy or edema is identified. No motor or sensory levels are noted. Crude visual fields are within normal range.  RADIOLOGY RESULTS: No current films for review  PLAN: At this time I am going to discontinue follow-up care based on her age mobility.  She continues close follow-up care with medical oncology.  Patient knows to call at anytime with any concerns.  I would like to take this opportunity to thank you for allowing me to participate in the care of your patient.[name], MD

## 2023-02-19 ENCOUNTER — Ambulatory Visit: Payer: Medicare Other | Admitting: Physician Assistant
